# Patient Record
Sex: Female | Born: 1990 | Race: Black or African American | Hispanic: No | Marital: Married | Smoking: Never smoker
Health system: Southern US, Community
[De-identification: ages and names within clinical notes are randomized; demographics above are authoritative.]

## PROBLEM LIST (undated history)

## (undated) DIAGNOSIS — R87629 Unspecified abnormal cytological findings in specimens from vagina: Secondary | ICD-10-CM

## (undated) DIAGNOSIS — A609 Anogenital herpesviral infection, unspecified: Secondary | ICD-10-CM

## (undated) DIAGNOSIS — K589 Irritable bowel syndrome without diarrhea: Secondary | ICD-10-CM

## (undated) HISTORY — DX: Unspecified abnormal cytological findings in specimens from vagina: R87.629

## (undated) HISTORY — DX: Anogenital herpesviral infection, unspecified: A60.9

## (undated) HISTORY — PX: DILATION AND CURETTAGE OF UTERUS: SHX78

---
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
## 2013-07-19 ENCOUNTER — Emergency Department (INDEPENDENT_AMBULATORY_CARE_PROVIDER_SITE_OTHER)
Admission: EM | Admit: 2013-07-19 | Discharge: 2013-07-19 | Disposition: A | Discharge status: Home / Self Care | Payer: BC Managed Care – PPO | Source: Home / Self Care | Attending: Family Medicine | Admitting: Family Medicine

## 2013-07-19 ENCOUNTER — Encounter (HOSPITAL_COMMUNITY): Payer: Self-pay | Admitting: Emergency Medicine

## 2013-07-19 DIAGNOSIS — H0015 Chalazion left lower eyelid: Secondary | ICD-10-CM

## 2013-07-19 DIAGNOSIS — H0019 Chalazion unspecified eye, unspecified eyelid: Secondary | ICD-10-CM

## 2013-07-19 MED ORDER — MOXIFLOXACIN HCL 0.5 % OP SOLN: 1.0000 [drp] | Freq: Three times a day (TID) | OPHTHALMIC | Status: DC

## 2013-07-19 NOTE — Discharge Instructions (Signed)
Call eye doctor in am for follow-up care of eyelid problem.

## 2013-07-19 NOTE — ED Notes (Signed)
Reports that on Saturday her left eye started giving her problems.  Reports lower lid swelling, mostly in am.  Reports swelling resolves during the day.  Reports lower lid is itchy.  Patient thinks she came in contact with clorox

## 2013-07-19 NOTE — ED Provider Notes (Signed)
CSN: [id number]     Arrival date & time 07/19/13  1635 History   First MD Initiated Contact with Patient 07/19/13 1826     Chief Complaint  Patient presents with  . Eye Problem   (Consider location/radiation/quality/duration/timing/severity/associated sxs/prior Treatment) Patient is a 23 y.o. female presenting with eye problem. The history is provided by the patient.  Eye Problem Location:  L eye Quality:  Stinging Severity:  Mild Onset quality:  Sudden Duration:  2 days Progression:  Worsening Chronicity:  New Ineffective treatments:  None tried Associated symptoms: no blurred vision, no crusting, no decreased vision, no discharge, no double vision, no photophobia and no redness     History reviewed. No pertinent past medical history. History reviewed. No pertinent past surgical history. No family history on file. History  Substance Use Topics  . Smoking status: Never Smoker   . Smokeless tobacco: Not on file  . Alcohol Use: No   OB History   Grav Para Term Preterm Abortions TAB SAB Ect Mult Living                 Review of Systems  Constitutional: Negative.   HENT: Negative.   Eyes: Positive for pain. Negative for blurred vision, double vision, photophobia, discharge, redness and visual disturbance.    Allergies  Review of patient's allergies indicates no known allergies.  Home Medications   Prior to Admission medications   Not on File   BP 89/58  Pulse 68  Temp(Src) 98.2 F (36.8 C) (Oral)  Resp 16  SpO2 100%  LMP 07/19/2013 Physical Exam  Nursing note and vitals reviewed. Constitutional: She is oriented to person, place, and time. She appears well-developed and well-nourished.  Eyes: Conjunctivae and EOM are normal. Pupils are equal, round, and reactive to light. Left eye exhibits hordeolum.    Neck: Normal range of motion. Neck supple.  Neurological: She is alert and oriented to person, place, and time.  Skin: Skin is warm and dry.    ED Course    Procedures (including critical care time) Labs Review Labs Reviewed - No data to display  Imaging Review No results found.   MDM   1. Chalazion of left lower eyelid       Linna HoffJames D Kindl, MD 07/19/13 1859

## 2013-08-14 ENCOUNTER — Encounter (HOSPITAL_COMMUNITY): Payer: Self-pay | Admitting: Emergency Medicine

## 2013-08-14 DIAGNOSIS — IMO0001 Reserved for inherently not codable concepts without codable children: Secondary | ICD-10-CM | POA: Insufficient documentation

## 2013-08-14 DIAGNOSIS — N390 Urinary tract infection, site not specified: Secondary | ICD-10-CM | POA: Insufficient documentation

## 2013-08-14 NOTE — ED Notes (Signed)
Pt.  reports fever with body aches , chills and bilateral earache.

## 2013-08-15 ENCOUNTER — Emergency Department (HOSPITAL_COMMUNITY)
Admission: EM | Admit: 2013-08-15 | Discharge: 2013-08-15 | Disposition: A | Discharge status: Home / Self Care | Payer: Self-pay | Attending: Emergency Medicine | Admitting: Emergency Medicine

## 2013-08-15 ENCOUNTER — Emergency Department (HOSPITAL_COMMUNITY): Payer: BC Managed Care – PPO

## 2013-08-15 DIAGNOSIS — N390 Urinary tract infection, site not specified: Secondary | ICD-10-CM

## 2013-08-15 DIAGNOSIS — M791 Myalgia, unspecified site: Secondary | ICD-10-CM

## 2013-08-15 LAB — COMPREHENSIVE METABOLIC PANEL
ALBUMIN: 3.9 g/dL (ref 3.5–5.2)
ALT: 10 U/L (ref 0–35)
AST: 18 U/L (ref 0–37)
Alkaline Phosphatase: 55 U/L (ref 39–117)
BUN: 9 mg/dL (ref 6–23)
CALCIUM: 9.3 mg/dL (ref 8.4–10.5)
CO2: 25 mEq/L (ref 19–32)
Chloride: 102 mEq/L (ref 96–112)
Creatinine, Ser: 0.64 mg/dL (ref 0.50–1.10)
GFR calc Af Amer: >90 mL/min (ref >90)
GFR calc non Af Amer: >90 mL/min (ref >90)
Glucose, Bld: 99 mg/dL (ref 70–99)
Potassium: 4 mEq/L (ref 3.7–5.3)
SODIUM: 139 meq/L (ref 137–147)
TOTAL PROTEIN: 7.3 g/dL (ref 6.0–8.3)
Total Bilirubin: 0.3 mg/dL (ref 0.3–1.2)

## 2013-08-15 LAB — CBC WITH DIFFERENTIAL/PLATELET
BASOS ABS: 0 10*3/uL (ref 0.0–0.1)
BASOS PCT: 0 % (ref 0–1)
EOS ABS: 0 10*3/uL (ref 0.0–0.7)
Eosinophils Relative: 0 % (ref 0–5)
HCT: 32.6 % — ABNORMAL LOW (ref 36.0–46.0)
HEMOGLOBIN: 11.4 g/dL — AB (ref 12.0–15.0)
LYMPHS PCT: 11 % — AB (ref 12–46)
Lymphs Abs: 1.7 10*3/uL (ref 0.7–4.0)
MCH: 31.7 pg (ref 26.0–34.0)
MCHC: 35 g/dL (ref 30.0–36.0)
MCV: 90.6 fL (ref 78.0–100.0)
Monocytes Absolute: 1.5 10*3/uL — ABNORMAL HIGH (ref 0.1–1.0)
Monocytes Relative: 10 % (ref 3–12)
NEUTROS PCT: 79 % — AB (ref 43–77)
Neutro Abs: 12.2 10*3/uL — ABNORMAL HIGH (ref 1.7–7.7)
PLATELETS: 235 10*3/uL (ref 150–400)
RBC: 3.6 MIL/uL — ABNORMAL LOW (ref 3.87–5.11)
RDW: 12 % (ref 11.5–15.5)
WBC: 15.4 10*3/uL — ABNORMAL HIGH (ref 4.0–10.5)

## 2013-08-15 LAB — URINALYSIS, ROUTINE W REFLEX MICROSCOPIC
BILIRUBIN URINE: NEGATIVE
Glucose, UA: NEGATIVE mg/dL
HGB URINE DIPSTICK: NEGATIVE
Ketones, ur: 15 mg/dL — AB
NITRITE: NEGATIVE
PH: 8 (ref 5.0–8.0)
Protein, ur: 30 mg/dL — AB
SPECIFIC GRAVITY, URINE: 1.034 — AB (ref 1.005–1.030)
Urobilinogen, UA: 1 mg/dL (ref 0.0–1.0)

## 2013-08-15 LAB — URINE MICROSCOPIC-ADD ON

## 2013-08-15 LAB — PREGNANCY, URINE: PREG TEST UR: NEGATIVE

## 2013-08-15 MED ORDER — SODIUM CHLORIDE 0.9 % IV BOLUS (SEPSIS)
1000.0000 mL | Freq: Once | INTRAVENOUS | Status: AC
  Administered 2013-08-15: 1000 mL via INTRAVENOUS

## 2013-08-15 MED ORDER — CEPHALEXIN 500 MG PO CAPS: 500.0000 mg | ORAL_CAPSULE | Freq: Three times a day (TID) | ORAL | Status: DC

## 2013-08-15 NOTE — ED Provider Notes (Signed)
CSN: [id number]     Arrival date & time 08/14/13  2253 History   First MD Initiated Contact with Patient 08/15/13 0049     Chief Complaint  Patient presents with  . Fever  . Generalized Body Aches  . Headache     (Consider location/radiation/quality/duration/timing/severity/associated sxs/prior Treatment) HPI Generally healthy young woman to ED with about 6 hrs of subjective fever and myalgias. She says her mom checked her forehead with her hand and told the patient that it felt like her temp was about 102F. However, she did not check her temp with a thermometer because she didn't have one. She says she felt achy all over. She took a Tylenol and now feels better.   She denies URI sx. She has felt nauseated but, denies vomiting. Denies abdominal pain, diarrhea, dysuria, vaginal discharge.   History reviewed. No pertinent past medical history. History reviewed. No pertinent past surgical history. No family history on file. History  Substance Use Topics  . Smoking status: Never Smoker   . Smokeless tobacco: Not on file  . Alcohol Use: No   OB History   Grav Para Term Preterm Abortions TAB SAB Ect Mult Living                 Review of Systems Ten point review of symptoms performed and is negative with the exception of symptoms noted above.     Allergies  Review of patient's allergies indicates no known allergies.  Home Medications   Prior to Admission medications   Medication Sig Start Date End Date Taking? Authorizing Provider  acetaminophen (TYLENOL) 500 MG tablet Take 1,000 mg by mouth every 6 (six) hours as needed for moderate pain.   Yes Historical Provider, MD   BP 104/57  Pulse 83  Temp(Src) 99.1 F (37.3 C) (Oral)  Resp 18  Wt 146 lb (66.225 kg)  SpO2 100%  LMP 07/19/2013 Physical Exam Gen: well developed and well nourished appearing Head: NCAT Eyes: PERL, EOMI Nose: no epistaixis or rhinorrhea Mouth/throat: mucosa is moist and pink Neck: supple, no  stridor, no cervical lymphadenopathy Lungs: CTA B, no wheezing, rhonchi or rales CV: RRR, no murmur, extremities appear well perfused.  Abd: soft, notender, nondistended Back: no ttp, no cva ttp Skin: warm and dry no rash Ext: normal to inspection, no dependent edema Neuro: CN ii-xii grossly intact, no focal deficits Psyche; normal affect,  calm and cooperative.   ED Course  Procedures (including critical care time) Labs Review  Results for orders placed during the hospital encounter of 08/15/13 (from the past 24 hour(s))  CBC WITH DIFFERENTIAL     Status: Abnormal   Collection Time    08/15/13 12:47 AM      Result Value Ref Range   WBC 15.4 (*) 4.0 - 10.5 K/uL   RBC 3.60 (*) 3.87 - 5.11 MIL/uL   Hemoglobin 11.4 (*) 12.0 - 15.0 g/dL   HCT 30.0 (*) 92.3 - 30.0 %   MCV 90.6  78.0 - 100.0 fL   MCH 31.7  26.0 - 34.0 pg   MCHC 35.0  30.0 - 36.0 g/dL   RDW 76.2  26.3 - 33.5 %   Platelets 235  150 - 400 K/uL   Neutrophils Relative % 79 (*) 43 - 77 %   Lymphocytes Relative 11 (*) 12 - 46 %   Monocytes Relative 10  3 - 12 %   Eosinophils Relative 0  0 - 5 %   Basophils Relative 0  0 - 1 %   Neutro Abs 12.2 (*) 1.7 - 7.7 K/uL   Lymphs Abs 1.7  0.7 - 4.0 K/uL   Monocytes Absolute 1.5 (*) 0.1 - 1.0 K/uL   Eosinophils Absolute 0.0  0.0 - 0.7 K/uL   Basophils Absolute 0.0  0.0 - 0.1 K/uL   RBC Morphology POLYCHROMASIA PRESENT     WBC Morphology ATYPICAL LYMPHOCYTES    COMPREHENSIVE METABOLIC PANEL     Status: None   Collection Time    08/15/13 12:47 AM      Result Value Ref Range   Sodium 139  137 - 147 mEq/L   Potassium 4.0  3.7 - 5.3 mEq/L   Chloride 102  96 - 112 mEq/L   CO2 25  19 - 32 mEq/L   Glucose, Bld 99  70 - 99 mg/dL   BUN 9  6 - 23 mg/dL   Creatinine, Ser 1.610.64  0.50 - 1.10 mg/dL   Calcium 9.3  8.4 - 09.610.5 mg/dL   Total Protein 7.3  6.0 - 8.3 g/dL   Albumin 3.9  3.5 - 5.2 g/dL   AST 18  0 - 37 U/L   ALT 10  0 - 35 U/L   Alkaline Phosphatase 55  39 - 117 U/L    Total Bilirubin 0.3  0.3 - 1.2 mg/dL   GFR calc non Af Amer >90  >90 mL/min   GFR calc Af Amer >90  >90 mL/min  URINALYSIS, ROUTINE W REFLEX MICROSCOPIC     Status: Abnormal   Collection Time    08/15/13  1:05 AM      Result Value Ref Range   Color, Urine YELLOW  YELLOW   APPearance CLOUDY (*) CLEAR   Specific Gravity, Urine 1.034 (*) 1.005 - 1.030   pH 8.0  5.0 - 8.0   Glucose, UA NEGATIVE  NEGATIVE mg/dL   Hgb urine dipstick NEGATIVE  NEGATIVE   Bilirubin Urine NEGATIVE  NEGATIVE   Ketones, ur 15 (*) NEGATIVE mg/dL   Protein, ur 30 (*) NEGATIVE mg/dL   Urobilinogen, UA 1.0  0.0 - 1.0 mg/dL   Nitrite NEGATIVE  NEGATIVE   Leukocytes, UA SMALL (*) NEGATIVE  PREGNANCY, URINE     Status: None   Collection Time    08/15/13  1:05 AM      Result Value Ref Range   Preg Test, Ur NEGATIVE  NEGATIVE  URINE MICROSCOPIC-ADD ON     Status: Abnormal   Collection Time    08/15/13  1:05 AM      Result Value Ref Range   Squamous Epithelial / LPF MANY (*) RARE   WBC, UA 11-20  <3 WBC/hpf   RBC / HPF 0-2  <3 RBC/hpf   Bacteria, UA MANY (*) RARE   Urine-Other MUCOUS PRESENT      MDM   Patient with contaminated urine which is notable for 11 to 20 wbc/hpf. Patient is feeling bertter after IVF. She is asking to go home. Pulse has noralized. She is stable for discharge with plan for close outpatient folllow up and empiric keflex for UTI.    Brandt LoosenJulie Sholanda Croson, MD 08/15/13 [phone]

## 2013-08-15 NOTE — ED Notes (Signed)
Patient transported to X-ray 

## 2014-04-27 ENCOUNTER — Encounter (HOSPITAL_COMMUNITY): Payer: Self-pay | Admitting: Emergency Medicine

## 2014-04-27 ENCOUNTER — Emergency Department (INDEPENDENT_AMBULATORY_CARE_PROVIDER_SITE_OTHER)
Admission: EM | Admit: 2014-04-27 | Discharge: 2014-04-27 | Disposition: A | Discharge status: Home / Self Care | Payer: BLUE CROSS/BLUE SHIELD | Source: Home / Self Care | Attending: Family Medicine | Admitting: Family Medicine

## 2014-04-27 DIAGNOSIS — N390 Urinary tract infection, site not specified: Secondary | ICD-10-CM

## 2014-04-27 LAB — POCT URINALYSIS DIP (DEVICE)
BILIRUBIN URINE: NEGATIVE
GLUCOSE, UA: NEGATIVE mg/dL
KETONES UR: NEGATIVE mg/dL
Nitrite: NEGATIVE
Protein, ur: >=300 mg/dL — AB
SPECIFIC GRAVITY, URINE: 1.025 (ref 1.005–1.030)
Urobilinogen, UA: 0.2 mg/dL (ref 0.0–1.0)
pH: 7.5 (ref 5.0–8.0)

## 2014-04-27 LAB — POCT PREGNANCY, URINE: Preg Test, Ur: NEGATIVE

## 2014-04-27 MED ORDER — CEPHALEXIN 500 MG PO CAPS: 500.0000 mg | ORAL_CAPSULE | Freq: Three times a day (TID) | ORAL | Status: DC

## 2014-04-27 NOTE — Discharge Instructions (Signed)
Thank you for coming in today. If your belly pain worsens, or you have high fever, bad vomiting, blood in your stool or black tarry stool go to the Emergency Room.   Urinary Tract Infection Urinary tract infections (UTIs) can develop anywhere along your urinary tract. Your urinary tract is your body's drainage system for removing wastes and extra water. Your urinary tract includes two kidneys, two ureters, a bladder, and a urethra. Your kidneys are a pair of bean-shaped organs. Each kidney is about the size of your fist. They are located below your ribs, one on each side of your spine. CAUSES Infections are caused by microbes, which are microscopic organisms, including fungi, viruses, and bacteria. These organisms are so small that they can only be seen through a microscope. Bacteria are the microbes that most commonly cause UTIs. SYMPTOMS  Symptoms of UTIs may vary by age and gender of the patient and by the location of the infection. Symptoms in young women typically include a frequent and intense urge to urinate and a painful, burning feeling in the bladder or urethra during urination. Older women and men are more likely to be tired, shaky, and weak and have muscle aches and abdominal pain. A fever may mean the infection is in your kidneys. Other symptoms of a kidney infection include pain in your back or sides below the ribs, nausea, and vomiting. DIAGNOSIS To diagnose a UTI, your caregiver will ask you about your symptoms. Your caregiver also will ask to provide a urine sample. The urine sample will be tested for bacteria and white blood cells. White blood cells are made by your body to help fight infection. TREATMENT  Typically, UTIs can be treated with medication. Because most UTIs are caused by a bacterial infection, they usually can be treated with the use of antibiotics. The choice of antibiotic and length of treatment depend on your symptoms and the type of bacteria causing your  infection. HOME CARE INSTRUCTIONS  If you were prescribed antibiotics, take them exactly as your caregiver instructs you. Finish the medication even if you feel better after you have only taken some of the medication.  Drink enough water and fluids to keep your urine clear or pale yellow.  Avoid caffeine, tea, and carbonated beverages. They tend to irritate your bladder.  Empty your bladder often. Avoid holding urine for long periods of time.  Empty your bladder before and after sexual intercourse.  After a bowel movement, women should cleanse from front to back. Use each tissue only once. SEEK MEDICAL CARE IF:   You have back pain.  You develop a fever.  Your symptoms do not begin to resolve within 3 days. SEEK IMMEDIATE MEDICAL CARE IF:   You have severe back pain or lower abdominal pain.  You develop chills.  You have nausea or vomiting.  You have continued burning or discomfort with urination. MAKE SURE YOU:   Understand these instructions.  Will watch your condition.  Will get help right away if you are not doing well or get worse. Document Released: 12/05/2004 Document Revised: 08/27/2011 Document Reviewed: 04/05/2011 Exit[employer] Patient Information 2015 [employer]. This information is not intended to replace advice given to you by your health care provider. Make sure you discuss any questions you have with your health care provider.  

## 2014-04-27 NOTE — ED Notes (Signed)
C/o UTI sx onset 4 days Sx include: dysuria, hematuria, cloudy urine Denies fevers, chills, abd pain, d/c Alert, no signs of acute distress.

## 2014-04-27 NOTE — ED Provider Notes (Signed)
Shanon AceVashti Benjiman CoreHinton is a 24 y.o. female who presents to Urgent Care today for urinary frequency urgency dysuria or cloudy area and some hematuria present for 2 days. Symptoms consistent with prior episodes of urinary tract infection. No fevers or chills nausea vomiting or diarrhea. No vaginal discharge. Patient has not tried any medications yet. She feels well otherwise.   History reviewed. No pertinent past medical history. History reviewed. No pertinent past surgical history. History  Substance Use Topics  . Smoking status: Never Smoker   . Smokeless tobacco: Not on file  . Alcohol Use: No   ROS as above Medications: No current facility-administered medications for this encounter.   Current Outpatient Prescriptions  Medication Sig Dispense Refill  . acetaminophen (TYLENOL) 500 MG tablet Take 1,000 mg by mouth every 6 (six) hours as needed for moderate pain.    . cephALEXin (KEFLEX) 500 MG capsule Take 1 capsule (500 mg total) by mouth 3 (three) times daily. 21 capsule 0   No Known Allergies   Exam:  BP 108/69 mmHg  Pulse 68  Temp(Src) 98.3 F (36.8 C) (Oral)  Resp 14  SpO2 100% Gen: Well NAD HEENT: EOMI,  MMM Lungs: Normal work of breathing. CTABL Heart: RRR no MRG Abd: NABS, Soft. Nondistended, Nontender no CV angle tenderness to percussion Exts: Brisk capillary refill, warm and well perfused.   Results for orders placed or performed during the hospital encounter of 04/27/14 (from the past 24 hour(s))  POCT urinalysis dip (device)     Status: Abnormal   Collection Time: 04/27/14  1:36 PM  Result Value Ref Range   Glucose, UA NEGATIVE NEGATIVE mg/dL   Bilirubin Urine NEGATIVE NEGATIVE   Ketones, ur NEGATIVE NEGATIVE mg/dL   Specific Gravity, Urine 1.025 1.005 - 1.030   Hgb urine dipstick LARGE (A) NEGATIVE   pH 7.5 5.0 - 8.0   Protein, ur >=300 (A) NEGATIVE mg/dL   Urobilinogen, UA 0.2 0.0 - 1.0 mg/dL   Nitrite NEGATIVE NEGATIVE   Leukocytes, UA LARGE (A) NEGATIVE   Pregnancy, urine POC     Status: None   Collection Time: 04/27/14  1:42 PM  Result Value Ref Range   Preg Test, Ur NEGATIVE NEGATIVE   No results found.  Assessment and Plan: 24 y.o. female with urinary tract infection. Treat with Keflex.  Discussed warning signs or symptoms. Please see discharge instructions. Patient expresses understanding.     Rodolph BongEvan S Romon Devereux, MD 04/27/14 [phone]

## 2014-05-08 ENCOUNTER — Encounter (HOSPITAL_COMMUNITY): Payer: Self-pay | Admitting: *Deleted

## 2014-05-08 ENCOUNTER — Emergency Department (HOSPITAL_COMMUNITY): Payer: BLUE CROSS/BLUE SHIELD

## 2014-05-08 ENCOUNTER — Inpatient Hospital Stay (HOSPITAL_COMMUNITY)
Admission: EM | Admit: 2014-05-08 | Discharge: 2014-05-11 | DRG: 872 | Disposition: A | Discharge status: Home / Self Care | Payer: BLUE CROSS/BLUE SHIELD | Attending: Internal Medicine | Admitting: Internal Medicine

## 2014-05-08 DIAGNOSIS — D72829 Elevated white blood cell count, unspecified: Secondary | ICD-10-CM | POA: Diagnosis present

## 2014-05-08 DIAGNOSIS — R509 Fever, unspecified: Secondary | ICD-10-CM | POA: Insufficient documentation

## 2014-05-08 DIAGNOSIS — B9620 Unspecified Escherichia coli [E. coli] as the cause of diseases classified elsewhere: Secondary | ICD-10-CM | POA: Diagnosis present

## 2014-05-08 DIAGNOSIS — N12 Tubulo-interstitial nephritis, not specified as acute or chronic: Secondary | ICD-10-CM | POA: Diagnosis present

## 2014-05-08 DIAGNOSIS — A419 Sepsis, unspecified organism: Principal | ICD-10-CM | POA: Diagnosis present

## 2014-05-08 DIAGNOSIS — R112 Nausea with vomiting, unspecified: Secondary | ICD-10-CM | POA: Diagnosis present

## 2014-05-08 LAB — CBC
HCT: 36.4 % (ref 36.0–46.0)
Hemoglobin: 12.4 g/dL (ref 12.0–15.0)
MCH: 31.2 pg (ref 26.0–34.0)
MCHC: 34.1 g/dL (ref 30.0–36.0)
MCV: 91.7 fL (ref 78.0–100.0)
Platelets: 300 10*3/uL (ref 150–400)
RBC: 3.97 MIL/uL (ref 3.87–5.11)
RDW: 12.2 % (ref 11.5–15.5)
WBC: 14.2 10*3/uL — ABNORMAL HIGH (ref 4.0–10.5)

## 2014-05-08 LAB — BASIC METABOLIC PANEL
ANION GAP: 8 (ref 5–15)
BUN: 11 mg/dL (ref 6–23)
CALCIUM: 8.9 mg/dL (ref 8.4–10.5)
CO2: 22 mmol/L (ref 19–32)
Chloride: 104 mmol/L (ref 96–112)
Creatinine, Ser: 0.69 mg/dL (ref 0.50–1.10)
Glucose, Bld: 98 mg/dL (ref 70–99)
Potassium: 3.6 mmol/L (ref 3.5–5.1)
SODIUM: 134 mmol/L — AB (ref 135–145)

## 2014-05-08 LAB — URINALYSIS, ROUTINE W REFLEX MICROSCOPIC
Bilirubin Urine: NEGATIVE
GLUCOSE, UA: NEGATIVE mg/dL
Ketones, ur: NEGATIVE mg/dL
Nitrite: POSITIVE — AB
PH: 7 (ref 5.0–8.0)
Protein, ur: 100 mg/dL — AB
SPECIFIC GRAVITY, URINE: 1.015 (ref 1.005–1.030)
UROBILINOGEN UA: 0.2 mg/dL (ref 0.0–1.0)

## 2014-05-08 LAB — I-STAT CG4 LACTIC ACID, ED: Lactic Acid, Venous: 2.25 mmol/L (ref 0.5–2.0)

## 2014-05-08 LAB — PREGNANCY, URINE: Preg Test, Ur: NEGATIVE

## 2014-05-08 LAB — URINE MICROSCOPIC-ADD ON

## 2014-05-08 MED ORDER — ACETAMINOPHEN 500 MG PO TABS
1000.0000 mg | ORAL_TABLET | Freq: Once | ORAL | Status: AC
  Administered 2014-05-08: 1000 mg via ORAL
  Filled 2014-05-08: qty 2

## 2014-05-08 MED ORDER — SODIUM CHLORIDE 0.9 % IV BOLUS (SEPSIS)
1000.0000 mL | Freq: Once | INTRAVENOUS | Status: AC
  Administered 2014-05-08: 1000 mL via INTRAVENOUS

## 2014-05-08 MED ORDER — ONDANSETRON HCL 4 MG/2ML IJ SOLN
4.0000 mg | Freq: Once | INTRAMUSCULAR | Status: AC
  Administered 2014-05-08: 4 mg via INTRAVENOUS
  Filled 2014-05-08: qty 2

## 2014-05-08 MED ORDER — KETOROLAC TROMETHAMINE 30 MG/ML IJ SOLN
30.0000 mg | Freq: Once | INTRAMUSCULAR | Status: AC
  Administered 2014-05-08: 30 mg via INTRAVENOUS
  Filled 2014-05-08: qty 1

## 2014-05-08 MED ORDER — DEXTROSE 5 % IV SOLN: 1.0000 g | Freq: Once | INTRAVENOUS | Status: DC

## 2014-05-08 MED ORDER — ACETAMINOPHEN 500 MG PO TABS
500.0000 mg | ORAL_TABLET | Freq: Once | ORAL | Status: DC
  Filled 2014-05-08: qty 1

## 2014-05-08 MED ORDER — DEXTROSE 5 % IV SOLN
1.0000 g | Freq: Once | INTRAVENOUS | Status: AC
  Administered 2014-05-08: 1 g via INTRAVENOUS
  Filled 2014-05-08: qty 10

## 2014-05-08 MED ORDER — SODIUM CHLORIDE 0.9 % IV BOLUS (SEPSIS): 1000.0000 mL | Freq: Once | INTRAVENOUS | Status: DC

## 2014-05-08 MED ORDER — SODIUM CHLORIDE 0.9 % IV SOLN: 1000.0000 mL | INTRAVENOUS | Status: DC

## 2014-05-08 NOTE — ED Provider Notes (Signed)
The patient is a 24 year old female, she had a recent urinary tract infection and presents back to the hospital today with recurrent urinary symptoms as well as flank pain, fever, chills, myalgias, headache and a new onset sore throat. She states that she took a full 7 days of Keflex, tolerated the medication well and improved however she had recurrent symptoms over the course of the day. On exam the patient has a soft nontender abdomen, right CVA tenderness, no peripheral edema, mild tachycardia, obvious rigors, clear oropharynx with moist mucous membranes and no pharyngeal erythema. She has a very supple neck without lymphadenopathy or any stiffness. Conjunctivae are clear.  Likely recurrent urinary infection, she will need IV fluids, she is mildly hypotensive at 96 systolic, heart rate of 101, no objective fever on arrival.  Throughout the patient's course she stayed persistently tachycardic, her blood pressure improved from 96 systolic 210 with IV fluids. She was given 30 mL/kg bolus which was approximately 2000 mL. Her temperature measured at 101.6, her white blood cell count was over 14,000, lactic acid was ordered, the patient appears to have sepsis from pyelonephritis.  Discussed with the patient and her family members the risks benefits and alternatives of admission to the hospital. They have agreed with admission to the hospital. Maryclare LabradorWe'll discuss with the internal medicine physicians for admission. The patient does not have her own family doctor, she still occasionally goes to her pediatric practice.  Critical care delivered for sepsis related to his severe pyelonephritis involving leukocytosis, tachycardia, hypotension, fever. 2 g of Rocephin given.  CRITICAL CARE Performed by: Vida RollerMILLER,Nika Yazzie D Total critical care time: 35 Critical care time was exclusive of separately billable procedures and treating other patients. Critical care was necessary to treat or prevent imminent or life-threatening  deterioration. Critical care was time spent personally by me on the following activities: development of treatment plan with patient and/or surrogate as well as nursing, discussions with consultants, evaluation of patient's response to treatment, examination of patient, obtaining history from patient or surrogate, ordering and performing treatments and interventions, ordering and review of laboratory studies, ordering and review of radiographic studies, pulse oximetry and re-evaluation of patient's condition.    Filed Vitals:   05/08/14 2109  BP: 122/60  Pulse: 101  Temp: 99.5 F (37.5 C)  TempSrc: Oral  Resp: 14  SpO2: 100%   Medical screening examination/treatment/procedure(s) were conducted as a shared visit with non-physician practitioner(s) and myself.  I personally evaluated the patient during the encounter.  Clinical Impression:   Final diagnoses:  Sepsis, due to unspecified organism  Pyelonephritis          Vida RollerBrian D Jael Waldorf, MD 05/09/14 2027

## 2014-05-08 NOTE — ED Provider Notes (Signed)
CSN: [id number]     Arrival date & time 05/08/14  2045 History   First MD Initiated Contact with Patient 05/08/14 2059     Chief Complaint  Patient presents with  . Fever     (Consider location/radiation/quality/duration/timing/severity/associated sxs/prior Treatment) Patient is a 24 y.o. female presenting with fever. The history is provided by the patient. No language interpreter was used.  Fever Associated symptoms: chills and headaches   Associated symptoms: no cough   Ms. Coppernoll had a UTI last week and presents with fever, body aches, chills, back pain, dysuria, and vomiting today.  Her dad says she finished a course of keflex for the UTI on Wednesday but began having gradual recurring urinary symptoms on Thursday.  She denies any shortness of breath, cough, or sore throat.   History reviewed. No pertinent past medical history. History reviewed. No pertinent past surgical history. History reviewed. No pertinent family history. History  Substance Use Topics  . Smoking status: Never Smoker   . Smokeless tobacco: Never Used  . Alcohol Use: No   OB History    No data available     Review of Systems  Constitutional: Positive for fever and chills.  Respiratory: Negative for cough and shortness of breath.   Neurological: Positive for headaches. Negative for syncope.  All other systems reviewed and are negative.     Allergies  Review of patient's allergies indicates no known allergies.  Home Medications   Prior to Admission medications   Medication Sig Start Date End Date Taking? Authorizing Provider  acetaminophen (TYLENOL) 500 MG tablet Take 1,000 mg by mouth every 6 (six) hours as needed for moderate pain.   Yes Historical Provider, MD  naproxen sodium (ANAPROX) 220 MG tablet Take 220 mg by mouth 3 (three) times daily as needed (pain).   Yes Historical Provider, MD  cephALEXin (KEFLEX) 500 MG capsule Take 1 capsule (500 mg total) by mouth 3 (three) times daily. Patient  not taking: Reported on 05/08/2014 04/27/14   Rodolph Bong, MD   BP 122/60 mmHg  Pulse 101  Temp(Src) 101.6 F (38.7 C) (Rectal)  Resp 14  SpO2 100%  LMP 05/07/2014 Physical Exam  Constitutional: She is oriented to person, place, and time. She appears well-developed and well-nourished.  HENT:  Head: Normocephalic and atraumatic.  Eyes: Conjunctivae are normal.  Neck: Normal range of motion. Neck supple.  Cardiovascular: Normal rate, regular rhythm and normal heart sounds.   Pulmonary/Chest: Effort normal and breath sounds normal.  Abdominal: Soft. There is tenderness in the suprapubic area and left upper quadrant.  Musculoskeletal: Normal range of motion.  Neurological: She is alert and oriented to person, place, and time.  Skin: Skin is warm and dry.  Nursing note and vitals reviewed.   ED Course  Procedures (including critical care time) Labs Review Labs Reviewed  URINALYSIS, ROUTINE W REFLEX MICROSCOPIC - Abnormal; Notable for the following:    APPearance TURBID (*)    Hgb urine dipstick LARGE (*)    Protein, ur 100 (*)    Nitrite POSITIVE (*)    Leukocytes, UA MODERATE (*)    All other components within normal limits  BASIC METABOLIC PANEL - Abnormal; Notable for the following:    Sodium 134 (*)    All other components within normal limits  CBC - Abnormal; Notable for the following:    WBC 14.2 (*)    All other components within normal limits  URINE MICROSCOPIC-ADD ON - Abnormal; Notable for the following:  Bacteria, UA MANY (*)    All other components within normal limits  I-STAT CG4 LACTIC ACID, ED - Abnormal; Notable for the following:    Lactic Acid, Venous 2.25 (*)    All other components within normal limits  URINE CULTURE  CULTURE, BLOOD (ROUTINE X 2)  CULTURE, BLOOD (ROUTINE X 2)  URINE CULTURE  PREGNANCY, URINE  CBC WITH DIFFERENTIAL/PLATELET  COMPREHENSIVE METABOLIC PANEL  URINALYSIS, ROUTINE W REFLEX MICROSCOPIC  I-STAT CG4 LACTIC ACID, ED     Imaging Review No results found.   EKG Interpretation None      MDM   Final diagnoses:  Sepsis, due to unspecified organism  Pyelonephritis  Labs: WBC 14.2.  Lactic acid 2.25. UA with nitrites and moderate leukocytes. Patient tachycardic throughout her stay. Rectal temp 101.6.  21:40 Patient given tylenol and 1g of rocephin.  22:49 Patient meets sepsis criteria and will be admitted. Patient and dad agree with plan. Patient given an addition 1g of rocephin.    Catha GosselinHanna Patel-Mills, PA-C 05/08/14 2319  Vida RollerBrian D Miller, MD 05/09/14 2027

## 2014-05-08 NOTE — ED Notes (Signed)
Dr. Hyacinth MeekerMiller notified of CG-4

## 2014-05-08 NOTE — ED Notes (Signed)
Pt. Was seen at her PCP for a UTI and had taken all her ABX. Starting today she was having N/V pt hot to the touch. Called EMS because she was weak and couldn't walk.

## 2014-05-09 DIAGNOSIS — D72829 Elevated white blood cell count, unspecified: Secondary | ICD-10-CM | POA: Diagnosis present

## 2014-05-09 DIAGNOSIS — N12 Tubulo-interstitial nephritis, not specified as acute or chronic: Secondary | ICD-10-CM

## 2014-05-09 DIAGNOSIS — R112 Nausea with vomiting, unspecified: Secondary | ICD-10-CM

## 2014-05-09 DIAGNOSIS — R509 Fever, unspecified: Secondary | ICD-10-CM

## 2014-05-09 LAB — CBC
HCT: 30.8 % — ABNORMAL LOW (ref 36.0–46.0)
Hemoglobin: 10.5 g/dL — ABNORMAL LOW (ref 12.0–15.0)
MCH: 31.7 pg (ref 26.0–34.0)
MCHC: 34.1 g/dL (ref 30.0–36.0)
MCV: 93.1 fL (ref 78.0–100.0)
Platelets: 249 10*3/uL (ref 150–400)
RBC: 3.31 MIL/uL — ABNORMAL LOW (ref 3.87–5.11)
RDW: 12.4 % (ref 11.5–15.5)
WBC: 9.5 10*3/uL (ref 4.0–10.5)

## 2014-05-09 LAB — BASIC METABOLIC PANEL
Anion gap: 6 (ref 5–15)
BUN: 8 mg/dL (ref 6–23)
CALCIUM: 8.1 mg/dL — AB (ref 8.4–10.5)
CO2: 24 mmol/L (ref 19–32)
Chloride: 108 mmol/L (ref 96–112)
Creatinine, Ser: 0.6 mg/dL (ref 0.50–1.10)
GFR calc non Af Amer: >90 mL/min (ref >90)
GLUCOSE: 91 mg/dL (ref 70–99)
POTASSIUM: 3.3 mmol/L — AB (ref 3.5–5.1)
Sodium: 138 mmol/L (ref 135–145)

## 2014-05-09 LAB — CLOSTRIDIUM DIFFICILE BY PCR: CDIFFPCR: NEGATIVE

## 2014-05-09 MED ORDER — POTASSIUM CHLORIDE CRYS ER 20 MEQ PO TBCR
40.0000 meq | EXTENDED_RELEASE_TABLET | Freq: Once | ORAL | Status: AC
  Administered 2014-05-09: 40 meq via ORAL
  Filled 2014-05-09: qty 2

## 2014-05-09 MED ORDER — CEFTRIAXONE SODIUM IN DEXTROSE 20 MG/ML IV SOLN
1.0000 g | INTRAVENOUS | Status: DC
  Administered 2014-05-09 – 2014-05-10 (×2): 1 g via INTRAVENOUS
  Filled 2014-05-09 (×3): qty 50

## 2014-05-09 MED ORDER — HYDROMORPHONE HCL 1 MG/ML IJ SOLN
0.5000 mg | INTRAMUSCULAR | Status: DC | PRN
  Filled 2014-05-09: qty 1

## 2014-05-09 MED ORDER — ENOXAPARIN SODIUM 40 MG/0.4ML ~~LOC~~ SOLN
40.0000 mg | SUBCUTANEOUS | Status: DC
  Administered 2014-05-09 – 2014-05-11 (×3): 40 mg via SUBCUTANEOUS
  Filled 2014-05-09 (×4): qty 0.4

## 2014-05-09 MED ORDER — SODIUM CHLORIDE 0.9 % IJ SOLN
3.0000 mL | Freq: Two times a day (BID) | INTRAMUSCULAR | Status: DC
  Administered 2014-05-09 – 2014-05-11 (×6): 3 mL via INTRAVENOUS

## 2014-05-09 MED ORDER — SODIUM CHLORIDE 0.9 % IV SOLN
INTRAVENOUS | Status: DC
  Administered 2014-05-09: 01:00:00 via INTRAVENOUS

## 2014-05-09 MED ORDER — ONDANSETRON HCL 4 MG PO TABS: 4.0000 mg | ORAL_TABLET | Freq: Four times a day (QID) | ORAL | Status: DC | PRN

## 2014-05-09 MED ORDER — ACETAMINOPHEN 325 MG PO TABS
650.0000 mg | ORAL_TABLET | Freq: Four times a day (QID) | ORAL | Status: DC | PRN
  Administered 2014-05-09 – 2014-05-11 (×5): 650 mg via ORAL
  Filled 2014-05-09 (×4): qty 2

## 2014-05-09 MED ORDER — ONDANSETRON HCL 4 MG/2ML IJ SOLN: 4.0000 mg | Freq: Four times a day (QID) | INTRAMUSCULAR | Status: DC | PRN

## 2014-05-09 MED ORDER — ALUM & MAG HYDROXIDE-SIMETH 200-200-20 MG/5ML PO SUSP: 30.0000 mL | Freq: Four times a day (QID) | ORAL | Status: DC | PRN

## 2014-05-09 MED ORDER — ACETAMINOPHEN 650 MG RE SUPP: 650.0000 mg | Freq: Four times a day (QID) | RECTAL | Status: DC | PRN

## 2014-05-09 MED ORDER — INFLUENZA VAC SPLIT QUAD 0.5 ML IM SUSY
0.5000 mL | PREFILLED_SYRINGE | INTRAMUSCULAR | Status: AC
  Administered 2014-05-10: 0.5 mL via INTRAMUSCULAR
  Filled 2014-05-09: qty 0.5

## 2014-05-09 MED ORDER — OXYCODONE HCL 5 MG PO TABS
5.0000 mg | ORAL_TABLET | ORAL | Status: DC | PRN
Start: 2014-05-09 — End: 2014-05-11
  Administered 2014-05-10 – 2014-05-11 (×3): 5 mg via ORAL
  Filled 2014-05-09 (×3): qty 1

## 2014-05-09 NOTE — Progress Notes (Addendum)
Patient admitted after midnight, please see H&P.  Continue IV abx, await cultures.  If fevers continue, renal U/S to r/o abscess Recently seen at urgent care with UTI- treated with keflex  Marlin CanaryJessica Cortlandt Capuano DO

## 2014-05-09 NOTE — H&P (Signed)
[facility] Admission History and Physical       Felicia Hatfield:[id number] DOB: 1990/05/06 DOA: 05/08/2014  Referring physician: EDP PCP: No primary care provider on file.  Specialists:   Chief Complaint: Fever and Chills  HPI: Felicia Hatfield is a 24 y.o. female who presents to the ED with complaints of fevers and chills and dysuria and ABD and Flank pain  X 2 days.  She was seen at and area [facility] and diagnosed with a UTI and was prescribed a 10 day course of Keflex of which she completed 3 days ago.   Her symptoms returned 2 days ago.   She was evaluated in the ED and found to have an positive UA and Blood and Urine cultures were sent and she was administered IV Rocephin and IV Fluids and referred for admission.      Review of Systems:  Constitutional: No Weight Loss, No Weight Gain, Night Sweats, +Fevers, +Chills, Dizziness, Light Headedness, Fatigue, or Generalized Weakness HEENT: No Headaches, Difficulty Swallowing,Tooth/Dental Problems,Sore Throat,  No Sneezing, Rhinitis, Ear Ache, Nasal Congestion, or Post Nasal Drip,  Cardio-vascular:  No Chest pain, Orthopnea, PND, Edema in Lower Extremities, Anasarca, Dizziness, Palpitations  Resp: No Dyspnea, No DOE, No Productive Cough, No Non-Productive Cough, No Hemoptysis, No Wheezing.    GI: No Heartburn, Indigestion, +Abdominal Pain, +Nausea, +Vomiting, Diarrhea, Constipation, Hematemesis, Hematochezia, Melena, Change in Bowel Habits,  Loss of Appetite  GU: +Dysuria, No Change in Color of Urine, No Urgency or Urinary Frequency, +Flank pain.  Musculoskeletal: No Joint Pain or Swelling, No Decreased Range of Motion, No Back Pain.  Neurologic: No Syncope, No Seizures, Muscle Weakness, Paresthesia, Vision Disturbance or Loss, No Diplopia, No Vertigo, No Difficulty Walking,  Skin: No Rash or Lesions. Psych: No Change in Mood or Affect, No Depression or Anxiety, No Memory loss, No Confusion, or Hallucinations   History reviewed. No  pertinent past medical history.   History reviewed. No pertinent past surgical history.    Prior to Admission medications   Medication Sig Start Date End Date Taking? Authorizing Provider  acetaminophen (TYLENOL) 500 MG tablet Take 1,000 mg by mouth every 6 (six) hours as needed for moderate pain.   Yes Historical Provider, MD  naproxen sodium (ANAPROX) 220 MG tablet Take 220 mg by mouth 3 (three) times daily as needed (pain).   Yes Historical Provider, MD  cephALEXin (KEFLEX) 500 MG capsule Take 1 capsule (500 mg total) by mouth 3 (three) times daily. Patient not taking: Reported on 05/08/2014 04/27/14   Rodolph Bong, MD     No Known Allergies  Social History:  reports that she has never smoked. She has never used smokeless tobacco. She reports that she does not drink alcohol or use illicit drugs.    History reviewed. No pertinent family history.     Physical Exam:  GEN:  Pleasant Well Nourished and Well Developed  24 y.o. African American female examined and in no acute distress; cooperative with exam Filed Vitals:   05/08/14 2115 05/08/14 2153 05/08/14 2230 05/09/14 0013  BP: 118/61  119/60 110/50  Pulse: 99  96 95  Temp:  101.6 F (38.7 C)  99.5 F (37.5 C)  TempSrc:  Rectal  Oral  Resp: Height:     (1.626 m)  Weight:    68.357 kg (150 lb 11.2 oz)  SpO2: 99%  100% 99%   Blood pressure 110/50, pulse 95, temperature 99.5 F (37.5 C), temperature source Oral,  resp. rate 20, height 5\' 4"  (1.626 m), weight 68.357 kg (150 lb 11.2 oz), last menstrual period 05/02/2014, SpO2 99 %. PSYCH: She is alert and oriented x4; does not appear anxious does not appear depressed; affect is normal HEENT: Normocephalic and Atraumatic, Mucous membranes pink; PERRLA; EOM intact; Fundi:  Benign;  No scleral icterus, Nares: Patent, Oropharynx: Clear,  Fair Dentition,    Neck:  FROM, No Cervical Lymphadenopathy nor Thyromegaly or Carotid Bruit; No JVD; Breasts:: Not examined CHEST  WALL: No tenderness CHEST: Normal respiration, clear to auscultation bilaterally HEART: Regular rate and rhythm; no murmurs rubs or gallops BACK: No kyphosis or scoliosis; No CVA tenderness ABDOMEN: Positive Bowel Sounds, Soft Non-Tender, No Rebound or Guarding; No Masses, No Organomegaly. Rectal Exam: Not done EXTREMITIES: No Cyanosis, Clubbing, or Edema; No Ulcerations. Genitalia: not examined PULSES: 2+ and symmetric SKIN: Normal hydration no rash or ulceration CNS:  Alert and Oriented x 4, No Focal Deficits Vascular: pulses palpable throughout    Labs on Admission:  Basic Metabolic Panel:  Recent Labs Lab 05/08/14 2126  NA 134*  K 3.6  CL 104  CO2 22  GLUCOSE 98  BUN 11  CREATININE 0.69  CALCIUM 8.9   Liver Function Tests: No results for input(s): AST, ALT, ALKPHOS, BILITOT, PROT, ALBUMIN in the last 168 hours. No results for input(s): LIPASE, AMYLASE in the last 168 hours. No results for input(s): AMMONIA in the last 168 hours. CBC:  Recent Labs Lab 05/08/14 2126  WBC 14.2*  HGB 12.4  HCT 36.4  MCV 91.7  PLT 300   Cardiac Enzymes: No results for input(s): CKTOTAL, CKMB, CKMBINDEX, TROPONINI in the last 168 hours.  BNP (last 3 results) No results for input(s): BNP in the last 8760 hours.  ProBNP (last 3 results) No results for input(s): PROBNP in the last 8760 hours.  CBG: No results for input(s): GLUCAP in the last 168 hours.  Radiological Exams on Admission: Dg Chest Port 1 View  05/08/2014   CLINICAL DATA:  Fever and body aches  EXAM: PORTABLE CHEST - 1 VIEW  COMPARISON:  08/15/2013  FINDINGS: Normal heart size and negative mediastinal contours (convexity of the upper right mediastinum is stable given accentuation by rightward rotation). No acute infiltrate or edema. No effusion or pneumothorax. Thoracic dextroscoliosis. No acute osseous findings.  IMPRESSION: No active disease.   Electronically Signed   By: Marnee SpringJonathon  Watts M.D.   On: 05/08/2014 23:46        Assessment/Plan:   24 y.o. female with  Principal Problem:   1.   Sepsis   Blood Cultures , and Urine Culture Sent   IV Rocephin  Active Problems:   2.   Pyelonephritis   IV Rocephin   Adjust PRN Urine Culture Results     3.   Leukocytosis   Monitor Trend     4.   Nausea and vomiting   Anti-Emetics PRN     5.   DVT Prophylaxis    Lovenox     Code Status:     FULL CODE        Family Communication:   Father at Bedside     Disposition Plan:    Inpatient Status        Time spent:  4960 70 Minutes      Ron ParkerJENKINS,Tyja Gortney C [facility] Pager [phone]   If 7AM -7PM Please Contact the Day Rounding Team MD for [facility]  If 7PM-7AM, Please Contact Night-Floor Coverage  [email or web address] Password  [email or web address] 05/09/2014, 4:50 AM     ADDENDUM:   Patient was seen and examined on 05/09/2014

## 2014-05-09 NOTE — Progress Notes (Signed)
Admission note:  Arrival Method: Pt arrived in wheelchair from ED Mental Orientation: Alert and oriented x 4 Telemetry: Telemetry box 7 applied, CCMD notified. Pt running NSR Assessment: Completed, see docflowsheets Skin: Dry and intact IV: Left AC IV with normal saline running at 18500ml/hr Pain: States no pain at this time Tubes: IV tubing secured Safety Measures: Moderate fall risk Fall Prevention Safety Plan: Reviewed with pt Admission Screening: Completed 6700 Orientation: Patient has been oriented to the unit, staff and to the room. Pt lying comfortably in bed with no needs stated at this time. Call light within reach, will continue to monitor.   Feliciana RossettiLaura Lindsi Bayliss, RN

## 2014-05-10 DIAGNOSIS — D72829 Elevated white blood cell count, unspecified: Secondary | ICD-10-CM

## 2014-05-10 DIAGNOSIS — A419 Sepsis, unspecified organism: Principal | ICD-10-CM

## 2014-05-10 LAB — BASIC METABOLIC PANEL
Anion gap: 10 (ref 5–15)
BUN: 8 mg/dL (ref 6–23)
CO2: 27 mmol/L (ref 19–32)
Calcium: 8.9 mg/dL (ref 8.4–10.5)
Chloride: 104 mmol/L (ref 96–112)
Creatinine, Ser: 0.55 mg/dL (ref 0.50–1.10)
GFR calc Af Amer: >90 mL/min (ref >90)
GFR calc non Af Amer: >90 mL/min (ref >90)
GLUCOSE: 93 mg/dL (ref 70–99)
POTASSIUM: 3.6 mmol/L (ref 3.5–5.1)
Sodium: 141 mmol/L (ref 135–145)

## 2014-05-10 LAB — CBC
HEMATOCRIT: 33.6 % — AB (ref 36.0–46.0)
HEMOGLOBIN: 11.2 g/dL — AB (ref 12.0–15.0)
MCH: 31.4 pg (ref 26.0–34.0)
MCHC: 33.3 g/dL (ref 30.0–36.0)
MCV: 94.1 fL (ref 78.0–100.0)
Platelets: 283 10*3/uL (ref 150–400)
RBC: 3.57 MIL/uL — AB (ref 3.87–5.11)
RDW: 12.4 % (ref 11.5–15.5)
WBC: 5.5 10*3/uL (ref 4.0–10.5)

## 2014-05-10 NOTE — Progress Notes (Signed)
Utilization Review Completed.Felicia Hatfield T3/03/2014  

## 2014-05-10 NOTE — Progress Notes (Signed)
PROGRESS NOTE  Regis Wiland ZOX:[id number] DOB: 1991/01/06 DOA: 05/08/2014 PCP: No primary care provider on file.  Assessment/Plan: 1. Sepsis Blood Cultures , and Urine Culture- e coli IV Rocephin  -recently treated UTI with keflex   2. Pyelonephritis IV Rocephin Adjust PRN Urine Culture Results    3. Leukocytosis resolved    4. Nausea and vomiting Anti-Emetics PRN   Code Status: full Family Communication: father Disposition Plan: home today or tomm   Consultants:    Procedures:      HPI/Subjective: Sleeping, no overnight events Eating well  Objective: Filed Vitals:   05/10/14 0513  BP: 107/66  Pulse: 63  Temp: 98.2 F (36.8 C)  Resp: 16   No intake or output data in the 24 hours ending 05/10/14 0830 Filed Weights   05/09/14 0013 05/09/14 2054  Weight: 68.357 kg (150 lb 11.2 oz) 68.947 kg (152 lb)    Exam:   General:  sleeping  Cardiovascular: rrr  Respiratory: clear  Abdomen: +BS, soft  Musculoskeletal: no edema   Data Reviewed: Basic Metabolic Panel:  Recent Labs Lab 05/08/14 2126 05/09/14 0449  NA 134* 138  K 3.6 3.3*  CL 104 108  CO2 22 24  GLUCOSE 98 91  BUN 11 8  CREATININE 0.69 0.60  CALCIUM 8.9 8.1*   Liver Function Tests: No results for input(s): AST, ALT, ALKPHOS, BILITOT, PROT, ALBUMIN in the last 168 hours. No results for input(s): LIPASE, AMYLASE in the last 168 hours. No results for input(s): AMMONIA in the last 168 hours. CBC:  Recent Labs Lab 05/08/14 2126 05/09/14 0449 05/10/14 0718  WBC 14.2* 9.5 5.5  HGB 12.4 10.5* 11.2*  HCT 36.4 30.8* 33.6*  MCV 91.7 93.1 94.1  PLT 300 249 283   Cardiac Enzymes: No results for input(s): CKTOTAL, CKMB, CKMBINDEX, TROPONINI in the last 168 hours. BNP (last 3 results) No results for input(s): BNP in the  last 8760 hours.  ProBNP (last 3 results) No results for input(s): PROBNP in the last 8760 hours.  CBG: No results for input(s): GLUCAP in the last 168 hours.  Recent Results (from the past 240 hour(s))  Urine culture     Status: None (Preliminary result)   Collection Time: 05/08/14  9:14 PM  Result Value Ref Range Status   Specimen Description URINE, CLEAN CATCH  Final   Special Requests NONE  Final   Colony Count   Final    >=100,000 COLONIES/ML Performed at [facility]    Culture   Final    ESCHERICHIA COLI Performed at [facility]    Report Status PENDING  Incomplete  Blood Culture (routine x 2)     Status: None (Preliminary result)   Collection Time: 05/08/14  9:34 PM  Result Value Ref Range Status   Specimen Description BLOOD RIGHT ARM  Final   Special Requests BOTTLES DRAWN AEROBIC AND ANAEROBIC 5CC EACH  Final   Culture   Final           BLOOD CULTURE RECEIVED NO GROWTH TO DATE CULTURE WILL BE HELD FOR 5 DAYS BEFORE ISSUING A FINAL NEGATIVE REPORT Performed at [facility]    Report Status PENDING  Incomplete  Blood Culture (routine x 2)     Status: None (Preliminary result)   Collection Time: 05/08/14 11:10 PM  Result Value Ref Range Status   Specimen Description BLOOD RIGHT HAND  Final   Special Requests BOTTLES DRAWN AEROBIC ONLY 2CC  Final   Culture  Final           BLOOD CULTURE RECEIVED NO GROWTH TO DATE CULTURE WILL BE HELD FOR 5 DAYS BEFORE ISSUING A FINAL NEGATIVE REPORT [facility]formed at [facility]    Report Status PENDING  Incomplete  Clostridium Difficile by PCR     Status: None   Collection Time: 05/09/14  6:37 AM  Result Value Ref Range Status   C difficile by pcr NEGATIVE NEGATIVE Final     Studies: Dg Chest Port 1 View  05/08/2014   CLINICAL DATA:  Fever and body aches  EXAM: PORTABLE CHEST - 1 VIEW  COMPARISON:  08/15/2013  FINDINGS: Normal heart size and negative mediastinal contours (convexity of the upper  right mediastinum is stable given accentuation by rightward rotation). No acute infiltrate or edema. No effusion or pneumothorax. Thoracic dextroscoliosis. No acute osseous findings.  IMPRESSION: No active disease.   Electronically Signed   By: Marnee SpringJonathon  Watts M.D.   On: 05/08/2014 23:46    Scheduled Meds: . cefTRIAXone (ROCEPHIN)  IV  1 g Intravenous Q24H  . enoxaparin (LOVENOX) injection  40 mg Subcutaneous Q24H  . Influenza vac split quadrivalent PF  0.5 mL Intramuscular Tomorrow-1000  . sodium chloride  3 mL Intravenous Q12H   Continuous Infusions: . sodium chloride 75 mL/hr at 05/09/14 1040   Antibiotics Given (last 72 hours)    Date/Time Action Medication Dose Rate   05/09/14 2358 Given   cefTRIAXone (ROCEPHIN) 1 g in dextrose 5 % 50 mL IVPB - Premix 1 g 100 mL/hr      Principal Problem:   Sepsis Active Problems:   Pyelonephritis   Leukocytosis   Nausea and vomiting    Time spent: 25 min    VANN, JESSICA  [facility] Pager [phone]. If 7PM-7AM, please contact night-coverage at [email or web address], password [email or web address] 05/10/2014, 8:30 AM  LOS: 2 days

## 2014-05-11 ENCOUNTER — Inpatient Hospital Stay (HOSPITAL_COMMUNITY): Payer: BLUE CROSS/BLUE SHIELD

## 2014-05-11 LAB — URINE CULTURE

## 2014-05-11 MED ORDER — IBUPROFEN 400 MG PO TABS
400.0000 mg | ORAL_TABLET | Freq: Once | ORAL | Status: AC
  Administered 2014-05-11: 400 mg via ORAL
  Filled 2014-05-11: qty 1

## 2014-05-11 MED ORDER — SULFAMETHOXAZOLE-TRIMETHOPRIM 800-160 MG PO TABS: 1.0000 | ORAL_TABLET | Freq: Two times a day (BID) | ORAL | Status: DC

## 2014-05-11 MED ORDER — SODIUM CHLORIDE 0.9 % IV BOLUS (SEPSIS)
1000.0000 mL | Freq: Once | INTRAVENOUS | Status: AC
  Administered 2014-05-11: 1000 mL via INTRAVENOUS

## 2014-05-11 NOTE — Care Management Note (Signed)
CARE MANAGEMENT NOTE 05/11/2014  Patient:  Felicia Hatfield,Kellen   Account Number:  [id number]  Date Initiated:  05/11/2014  Documentation initiated by:  Jamarques Pinedo  Subjective/Objective Assessment:   CM following for progression and d/c planning     Action/Plan:   Noted consult re student clinic on [employer]. 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   Anticipated DC Date:  05/12/2014   Anticipated DC Plan:  HOME/SELF CARE         Choice offered to / List presented to:             Status of service:  Completed, signed off Medicare Important Message given?  NO (If response is "NO", the following Medicare IM given date fields will be blank) Date Medicare IM given:   Medicare IM given by:   Date Additional Medicare IM given:   Additional Medicare IM given by:    Discharge Disposition:  HOME/SELF CARE  Per UR Regulation:    If discussed at Long Length of Stay Meetings, dates discussed:    Comments:

## 2014-05-11 NOTE — Progress Notes (Signed)
Discharge instructions and medications discussed with mother and patient. Prescription given to mother.  All questions answered.

## 2014-05-11 NOTE — Progress Notes (Signed)
PROGRESS NOTE  Felicia RoughVashti Hatfield SAY:[id number] DOB: 1990/05/10 DOA: 05/08/2014 PCP: No primary care provider on file.  Assessment/Plan: 1. Sepsis Blood Cultures , and Urine Culture- e coli IV Rocephin  -recently treated UTI with keflex  -change to PO bactrim at d/c   2. Pyelonephritis IV Rocephin Adjust PRN Urine Culture Results   -had episode of fever last Pm- will get renal U/S to r/o abscess (had flu shot yest AM)   3. Leukocytosis resolved    4. Nausea and vomiting Anti-Emetics PRN   Code Status: full Family Communication: father Disposition Plan:    Consultants:    Procedures:      HPI/Subjective: Had fever last night  Objective: Filed Vitals:   05/11/14 0902  BP: 107/52  Pulse: 65  Temp: 98 F (36.7 C)  Resp: 18    Intake/Output Summary (Last 24 hours) at 05/11/14 0953 Last data filed at 05/10/14 1827  Gross per 24 hour  Intake    360 ml  Output      0 ml  Net    360 ml   Filed Weights   05/09/14 0013 05/09/14 2054 05/11/14 0049  Weight: 68.357 kg (150 lb 11.2 oz) 68.947 kg (152 lb) 67.178 kg (148 lb 1.6 oz)    Exam:   General:  Feels ok  This AM- had sore throat last PM  Cardiovascular: rrr  Respiratory: clear  Abdomen: +BS, soft  Musculoskeletal: no edema   Data Reviewed: Basic Metabolic Panel:  Recent Labs Lab 05/08/14 2126 05/09/14 0449 05/10/14 0718  NA 134* 138 141  K 3.6 3.3* 3.6  CL 104 108 104  CO2 22 24 27   GLUCOSE 98 91 93  BUN 11 8 8   CREATININE 0.69 0.60 0.55  CALCIUM 8.9 8.1* 8.9   Liver Function Tests: No results for input(s): AST, ALT, ALKPHOS, BILITOT, PROT, ALBUMIN in the last 168 hours. No results for input(s): LIPASE, AMYLASE in the last 168 hours. No results for input(s): AMMONIA in the last 168 hours. CBC:  Recent Labs Lab 05/08/14 2126  05/09/14 0449 05/10/14 0718  WBC 14.2* 9.5 5.5  HGB 12.4 10.5* 11.2*  HCT 36.4 30.8* 33.6*  MCV 91.7 93.1 94.1  PLT 300 249 283   Cardiac Enzymes: No results for input(s): CKTOTAL, CKMB, CKMBINDEX, TROPONINI in the last 168 hours. BNP (last 3 results) No results for input(s): BNP in the last 8760 hours.  ProBNP (last 3 results) No results for input(s): PROBNP in the last 8760 hours.  CBG: No results for input(s): GLUCAP in the last 168 hours.  Recent Results (from the past 240 hour(s))  Urine culture     Status: None   Collection Time: 05/08/14  9:14 PM  Result Value Ref Range Status   Specimen Description URINE, CLEAN CATCH  Final   Special Requests NONE  Final   Colony Count   Final    >=100,000 COLONIES/ML Performed at [facility]    Culture   Final    ESCHERICHIA COLI Performed at [facility]    Report Status 05/11/2014 FINAL  Final   Organism ID, Bacteria ESCHERICHIA COLI  Final      Susceptibility   Escherichia coli - MIC*    AMPICILLIN <=2 SENSITIVE Sensitive     CEFAZOLIN <=4 SENSITIVE Sensitive     CEFTRIAXONE <=1 SENSITIVE Sensitive     CIPROFLOXACIN <=0.25 SENSITIVE Sensitive     GENTAMICIN <=1 SENSITIVE Sensitive     LEVOFLOXACIN <=0.12 SENSITIVE Sensitive  NITROFURANTOIN <=16 SENSITIVE Sensitive     TOBRAMYCIN <=1 SENSITIVE Sensitive     TRIMETH/SULFA <=20 SENSITIVE Sensitive     PIP/TAZO <=4 SENSITIVE Sensitive     * ESCHERICHIA COLI  Blood Culture (routine x 2)     Status: None (Preliminary result)   Collection Time: 05/08/14  9:34 PM  Result Value Ref Range Status   Specimen Description BLOOD RIGHT ARM  Final   Special Requests BOTTLES DRAWN AEROBIC AND ANAEROBIC 5CC EACH  Final   Culture   Final           BLOOD CULTURE RECEIVED NO GROWTH TO DATE CULTURE WILL BE HELD FOR 5 DAYS BEFORE ISSUING A FINAL NEGATIVE REPORT Performed at [facility]    Report Status PENDING  Incomplete  Blood Culture (routine x 2)      Status: None (Preliminary result)   Collection Time: 05/08/14 11:10 PM  Result Value Ref Range Status   Specimen Description BLOOD RIGHT HAND  Final   Special Requests BOTTLES DRAWN AEROBIC ONLY 2CC  Final   Culture   Final           BLOOD CULTURE RECEIVED NO GROWTH TO DATE CULTURE WILL BE HELD FOR 5 DAYS BEFORE ISSUING A FINAL NEGATIVE REPORT Performed at [facility]    Report Status PENDING  Incomplete  Clostridium Difficile by PCR     Status: None   Collection Time: 05/09/14  6:37 AM  Result Value Ref Range Status   C difficile by pcr NEGATIVE NEGATIVE Final     Studies: No results found.  Scheduled Meds: . cefTRIAXone (ROCEPHIN)  IV  1 g Intravenous Q24H  . enoxaparin (LOVENOX) injection  40 mg Subcutaneous Q24H  . sodium chloride  3 mL Intravenous Q12H   Continuous Infusions:   Antibiotics Given (last 72 hours)    Date/Time Action Medication Dose Rate   05/09/14 2358 Given   cefTRIAXone (ROCEPHIN) 1 g in dextrose 5 % 50 mL IVPB - Premix 1 g 100 mL/hr   05/10/14 2357 Given   cefTRIAXone (ROCEPHIN) 1 g in dextrose 5 % 50 mL IVPB - Premix 1 g 100 mL/hr      Principal Problem:   Sepsis Active Problems:   Pyelonephritis   Leukocytosis   Nausea and vomiting    Time spent: 25 min    Felicia Hatfield  [facility] Pager [phone]. If 7PM-7AM, please contact night-coverage at [email or web address], password [facility] 05/11/2014, 9:53 AM  LOS: 3 days

## 2014-05-12 NOTE — Discharge Summary (Signed)
Physician Discharge Summary  [name] ZOX:[id number] DOB: [birth year] DOA: 05/08/2014  PCP: No primary care provider on file.  Admit date: 05/08/2014 Discharge date: 05/12/2014  Time spent: 35 minutes  Recommendations for Outpatient Follow-up:  1.   Discharge Diagnoses:  Principal Problem:   Sepsis Active Problems:   Pyelonephritis   Leukocytosis   Nausea and vomiting   Discharge Condition: improved  Diet recommendation: regular  Filed Weights   05/09/14 0013 05/09/14 2054 05/11/14 0049  Weight: 68.357 kg (150 lb 11.2 oz) 68.947 kg (152 lb) 67.178 kg (148 lb 1.6 oz)    History of present illness:  Felicia Hatfield is a 24 y.o. female who presents to the ED with complaints of fevers and chills and dysuria and ABD and Flank pain X 2 days. She was seen at and area [facility] and diagnosed with a UTI and was prescribed a 10 day course of Keflex of which she completed 3 days ago. Her symptoms returned 2 days ago. She was evaluated in the ED and found to have an positive UA and Blood and Urine cultures were sent and she was administered IV Rocephin and IV Fluids and referred for admission.  Hospital Course:  Sepsis Blood Cultures NGTD   Urine Culture- e coli IV Rocephin -recently treated UTI with keflex -change to PO bactrim at d/c   Pyelonephritis treated -had episode of fever last Pm- will get renal U/S to r/o abscess (had flu shot yest AM)   3. Leukocytosis resolved    4. Nausea and vomiting Anti-Emetics PRN   Procedures:    Consultations:    Discharge Exam: Filed Vitals:   05/11/14 1800  BP:   Pulse:   Temp: 98.4 F (36.9 C)  Resp:     Discharge Instructions   Discharge Instructions    Diet general    Complete by:  As directed      Discharge instructions    Complete by:  As  directed   Return to ER for fever, n/v     Increase activity slowly    Complete by:  As directed           Discharge Medication List as of 05/11/2014  5:53 PM    START taking these medications   Details  sulfamethoxazole-trimethoprim (BACTRIM DS,SEPTRA DS) 800-160 MG per tablet Take 1 tablet by mouth 2 (two) times daily., Starting 05/11/2014, Until Discontinued, Print      CONTINUE these medications which have NOT CHANGED   Details  acetaminophen (TYLENOL) 500 MG tablet Take 1,000 mg by mouth every 6 (six) hours as needed for moderate pain., Until Discontinued, Historical Med      STOP taking these medications     naproxen sodium (ANAPROX) 220 MG tablet      cephALEXin (KEFLEX) 500 MG capsule        No Known Allergies Follow-up Information    Please follow up.   Why:  can follow up with PCP       The results of significant diagnostics from this hospitalization (including imaging, microbiology, ancillary and laboratory) are listed below for reference.    Significant Diagnostic Studies: US Renal  05/11/2014   CLINICAL DATA:  Fever 1 day.  Pyelonephritis, question abscess.  EXAM: RENAL/URINARY TRACT ULTRASOUND COMPLETE  COMPARISON:  None.  FINDINGS: Right Kidney:  Length: 12.3 cm. Echogenicity within normal limits. No mass or hydronephrosis visualized.  Left Kidney:  Length: 12.0 cm. Echogenicity within normal limits. No mass or hydronephrosis visualized.  Bladder:  Appears  normal for degree of bladder distention.  Kidneys demonstrate normal symmetric vascularity.  IMPRESSION: Normal renal ultrasound.   Electronically Signed   By: Elberta Fortis M.D.   On: 05/11/2014 12:04   Dg Chest Port 1 View  05/08/2014   CLINICAL DATA:  Fever and body aches  EXAM: PORTABLE CHEST - 1 VIEW  COMPARISON:  08/15/2013  FINDINGS: Normal heart size and negative mediastinal contours (convexity of the upper right mediastinum is stable given accentuation by rightward rotation). No acute infiltrate or  edema. No effusion or pneumothorax. Thoracic dextroscoliosis. No acute osseous findings.  IMPRESSION: No active disease.   Electronically Signed   By: Marnee Spring M.D.   On: 05/08/2014 23:46    Microbiology: Recent Results (from the past 240 hour(s))  Urine culture     Status: None   Collection Time: 05/08/14  9:14 PM  Result Value Ref Range Status   Specimen Description URINE, CLEAN CATCH  Final   Special Requests NONE  Final   Colony Count   Final    >=100,000 COLONIES/ML Performed at [facility]    Culture   Final    ESCHERICHIA COLI Performed at [facility]    Report Status 05/11/2014 FINAL  Final   Organism ID, Bacteria ESCHERICHIA COLI  Final      Susceptibility   Escherichia coli - MIC*    AMPICILLIN <=2 SENSITIVE Sensitive     CEFAZOLIN <=4 SENSITIVE Sensitive     CEFTRIAXONE <=1 SENSITIVE Sensitive     CIPROFLOXACIN <=0.25 SENSITIVE Sensitive     GENTAMICIN <=1 SENSITIVE Sensitive     LEVOFLOXACIN <=0.12 SENSITIVE Sensitive     NITROFURANTOIN <=16 SENSITIVE Sensitive     TOBRAMYCIN <=1 SENSITIVE Sensitive     TRIMETH/SULFA <=20 SENSITIVE Sensitive     PIP/TAZO <=4 SENSITIVE Sensitive     * ESCHERICHIA COLI  Blood Culture (routine x 2)     Status: None (Preliminary result)   Collection Time: 05/08/14  9:34 PM  Result Value Ref Range Status   Specimen Description BLOOD RIGHT ARM  Final   Special Requests BOTTLES DRAWN AEROBIC AND ANAEROBIC 5CC EACH  Final   Culture   Final           BLOOD CULTURE RECEIVED NO GROWTH TO DATE CULTURE WILL BE HELD FOR 5 DAYS BEFORE ISSUING A FINAL NEGATIVE REPORT Performed at [facility]    Report Status PENDING  Incomplete  Blood Culture (routine x 2)     Status: None (Preliminary result)   Collection Time: 05/08/14 11:10 PM  Result Value Ref Range Status   Specimen Description BLOOD RIGHT HAND  Final   Special Requests BOTTLES DRAWN AEROBIC ONLY 2CC  Final   Culture   Final           BLOOD CULTURE  RECEIVED NO GROWTH TO DATE CULTURE WILL BE HELD FOR 5 DAYS BEFORE ISSUING A FINAL NEGATIVE REPORT Performed at [facility]    Report Status PENDING  Incomplete  Clostridium Difficile by PCR     Status: None   Collection Time: 05/09/14  6:37 AM  Result Value Ref Range Status   C difficile by pcr NEGATIVE NEGATIVE Final     Labs: Basic Metabolic Panel:  Recent Labs Lab 05/08/14 2126 05/09/14 0449 05/10/14 0718  NA 134* 138 141  K 3.6 3.3* 3.6  CL 104 108 104  CO2 GLUCOSE 98 91 93  BUN 11 8 8  CREATININE 0.69 0.60 0.55  CALCIUM 8.9 8.1* 8.9   Liver Function Tests: No results for input(s): AST, ALT, ALKPHOS, BILITOT, PROT, ALBUMIN in the last 168 hours. No results for input(s): LIPASE, AMYLASE in the last 168 hours. No results for input(s): AMMONIA in the last 168 hours. CBC:  Recent Labs Lab 05/08/14 2126 05/09/14 0449 05/10/14 0718  WBC 14.2* 9.5 5.5  HGB 12.4 10.5* 11.2*  HCT 36.4 30.8* 33.6*  MCV 91.7 93.1 94.1  PLT 300 249 283   Cardiac Enzymes: No results for input(s): CKTOTAL, CKMB, CKMBINDEX, TROPONINI in the last 168 hours. BNP: BNP (last 3 results) No results for input(s): BNP in the last 8760 hours.  ProBNP (last 3 results) No results for input(s): PROBNP in the last 8760 hours.  CBG: No results for input(s): GLUCAP in the last 168 hours.     SignedMarlin Canary:  Melissa Pulido  [facility] 05/12/2014, 12:00 PM

## 2014-05-15 LAB — CULTURE, BLOOD (ROUTINE X 2)
CULTURE: NO GROWTH
Culture: NO GROWTH

## 2014-07-04 DIAGNOSIS — R509 Fever, unspecified: Secondary | ICD-10-CM | POA: Insufficient documentation

## 2014-07-21 ENCOUNTER — Emergency Department (HOSPITAL_COMMUNITY)
Admission: EM | Admit: 2014-07-21 | Discharge: 2014-07-21 | Discharge status: Against Medical Advice | Payer: BLUE CROSS/BLUE SHIELD | Attending: Emergency Medicine | Admitting: Emergency Medicine

## 2014-07-21 ENCOUNTER — Encounter (HOSPITAL_COMMUNITY): Payer: Self-pay

## 2014-07-21 DIAGNOSIS — R1110 Vomiting, unspecified: Secondary | ICD-10-CM | POA: Insufficient documentation

## 2014-07-21 LAB — CBC WITH DIFFERENTIAL/PLATELET
BASOS ABS: 0 10*3/uL (ref 0.0–0.1)
Basophils Relative: 0 % (ref 0–1)
Eosinophils Absolute: 0 10*3/uL (ref 0.0–0.7)
Eosinophils Relative: 0 % (ref 0–5)
HCT: 35.5 % — ABNORMAL LOW (ref 36.0–46.0)
HEMOGLOBIN: 12.1 g/dL (ref 12.0–15.0)
LYMPHS ABS: 1.4 10*3/uL (ref 0.7–4.0)
Lymphocytes Relative: 16 % (ref 12–46)
MCH: 31.3 pg (ref 26.0–34.0)
MCHC: 34.1 g/dL (ref 30.0–36.0)
MCV: 91.7 fL (ref 78.0–100.0)
MONOS PCT: 5 % (ref 3–12)
Monocytes Absolute: 0.5 10*3/uL (ref 0.1–1.0)
NEUTROS PCT: 79 % — AB (ref 43–77)
Neutro Abs: 6.9 10*3/uL (ref 1.7–7.7)
Platelets: 281 10*3/uL (ref 150–400)
RBC: 3.87 MIL/uL (ref 3.87–5.11)
RDW: 12.2 % (ref 11.5–15.5)
WBC: 8.8 10*3/uL (ref 4.0–10.5)

## 2014-07-21 LAB — COMPREHENSIVE METABOLIC PANEL
ALT: 17 U/L (ref 14–54)
ANION GAP: 9 (ref 5–15)
AST: 25 U/L (ref 15–41)
Albumin: 4.3 g/dL (ref 3.5–5.0)
Alkaline Phosphatase: 50 U/L (ref 38–126)
BUN: 10 mg/dL (ref 6–20)
CALCIUM: 8.8 mg/dL — AB (ref 8.9–10.3)
CO2: 24 mmol/L (ref 22–32)
Chloride: 107 mmol/L (ref 101–111)
Creatinine, Ser: 0.54 mg/dL (ref 0.44–1.00)
GFR calc Af Amer: >60 mL/min (ref >60)
GFR calc non Af Amer: >60 mL/min (ref >60)
GLUCOSE: 90 mg/dL (ref 65–99)
Potassium: 3.6 mmol/L (ref 3.5–5.1)
SODIUM: 140 mmol/L (ref 135–145)
TOTAL PROTEIN: 7.7 g/dL (ref 6.5–8.1)
Total Bilirubin: 0.3 mg/dL (ref 0.3–1.2)

## 2014-07-21 LAB — LIPASE, BLOOD: Lipase: 16 U/L — ABNORMAL LOW (ref 22–51)

## 2014-07-21 NOTE — ED Notes (Signed)
Pt came to [facility] and advised she had a paper due and needed to leave.  She has decided to leave AMA.
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
## 2014-07-21 NOTE — ED Notes (Signed)
Pt cannot void at this time 

## 2016-09-28 ENCOUNTER — Encounter: Payer: Self-pay | Admitting: Physician Assistant

## 2016-09-28 ENCOUNTER — Ambulatory Visit (INDEPENDENT_AMBULATORY_CARE_PROVIDER_SITE_OTHER): Payer: 59 | Admitting: Physician Assistant

## 2016-09-28 DIAGNOSIS — S0990XA Unspecified injury of head, initial encounter: Secondary | ICD-10-CM

## 2016-09-28 DIAGNOSIS — S00511A Abrasion of lip, initial encounter: Secondary | ICD-10-CM
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
## 2017-02-06 IMAGING — CR DG CHEST 1V PORT
1 series · 1 of 1 positions shown · non-contrast
Comparison: 08/15/2013

CLINICAL DATA: Fever and body aches

EXAM:
PORTABLE CHEST - 1 VIEW

[AP]
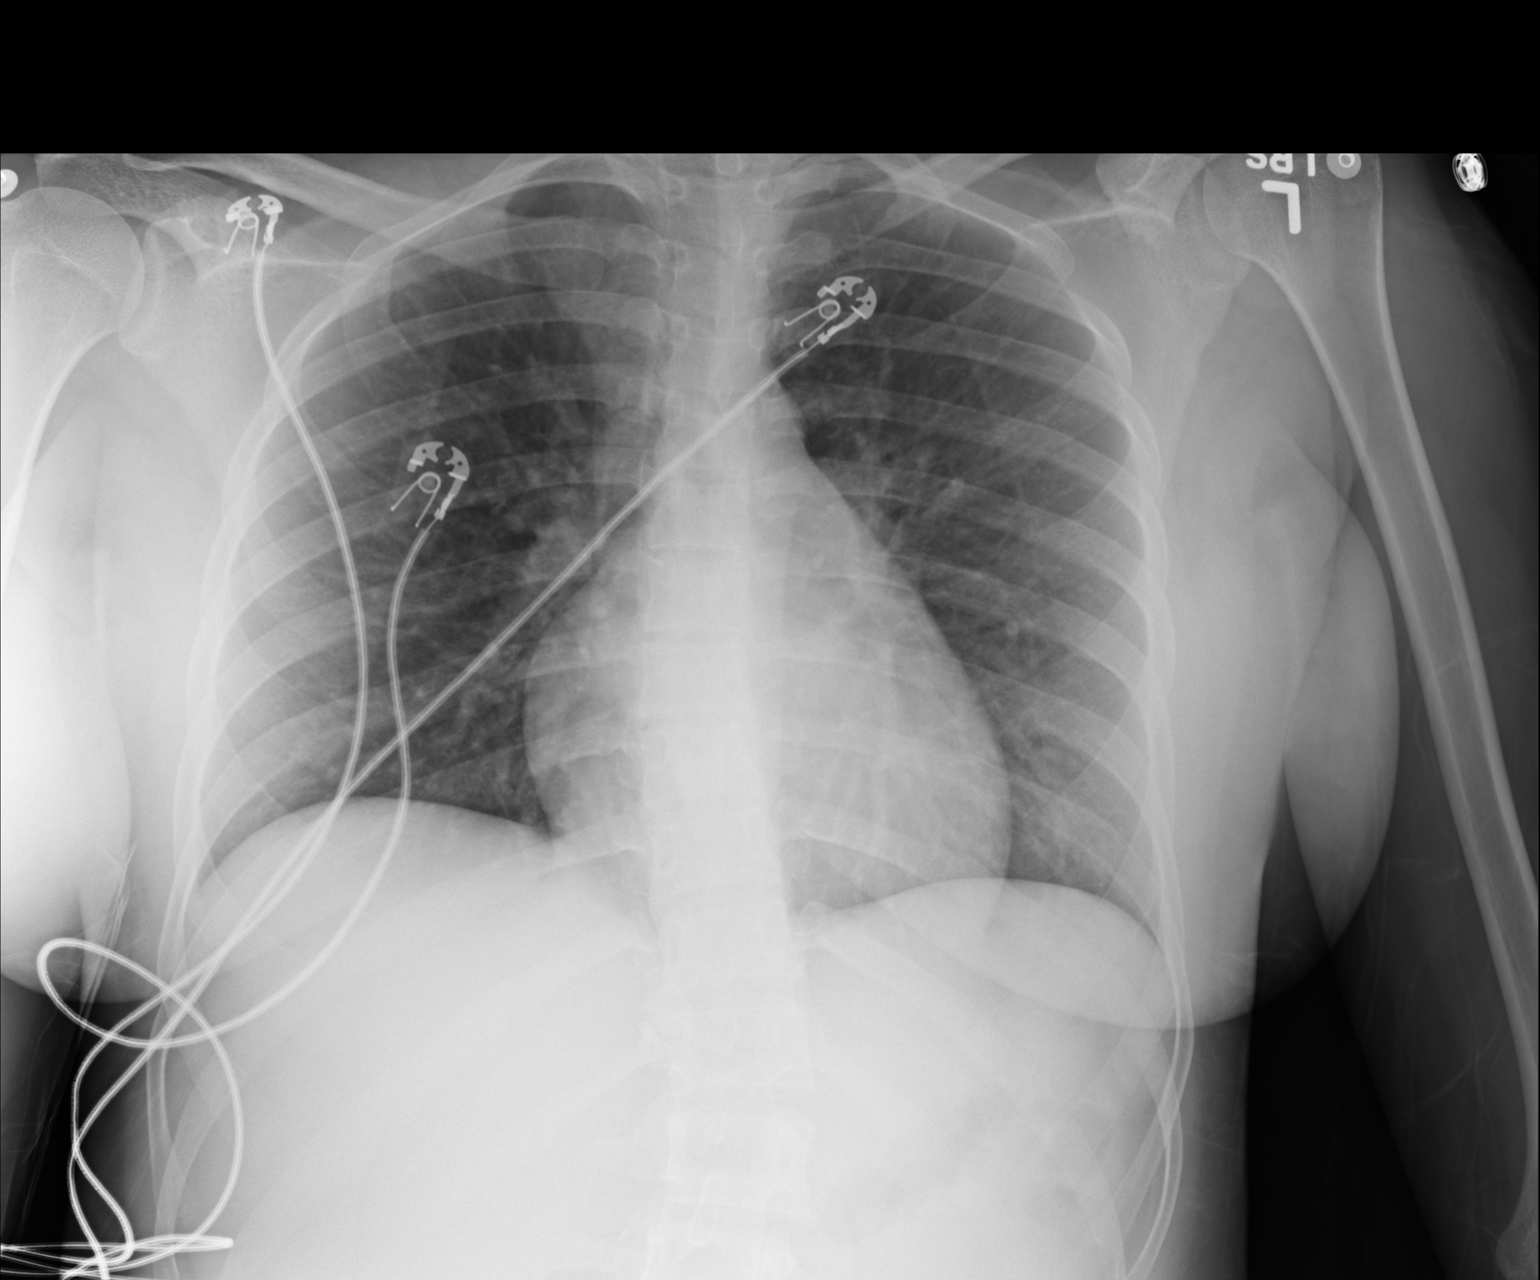

[1 of 1 positions shown; findings below may reference images not displayed]

FINDINGS: Normal heart size and negative mediastinal contours (convexity of
the upper right mediastinum is stable given accentuation by
rightward rotation). No acute infiltrate or edema. No effusion or
pneumothorax. Thoracic dextroscoliosis. No acute osseous findings.
IMPRESSION: No active disease.

## 2017-02-09 IMAGING — US US RENAL
1 series · 14 of 25 positions shown · non-contrast
Comparison: None.

CLINICAL DATA: Fever 1 day.  Pyelonephritis, question abscess.

EXAM:
RENAL/URINARY TRACT ULTRASOUND COMPLETE

[Series 1: us renal · 0.24mm/px · 14 of 32 slices shown]
[im 1/32]
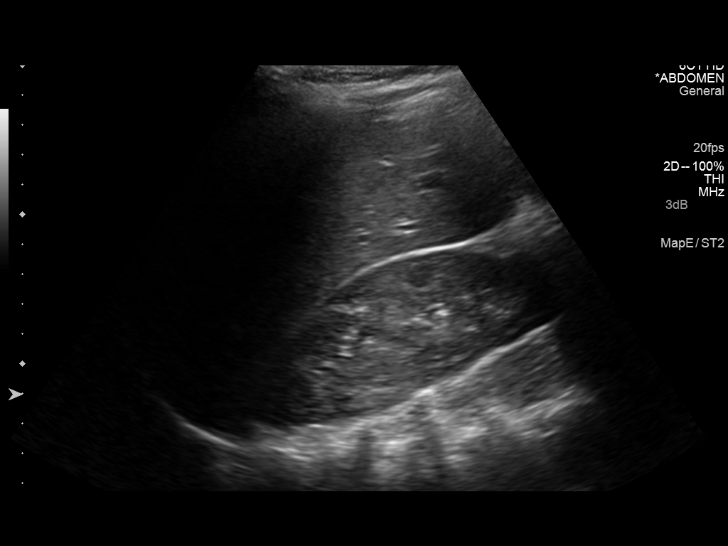
[im 3/32]
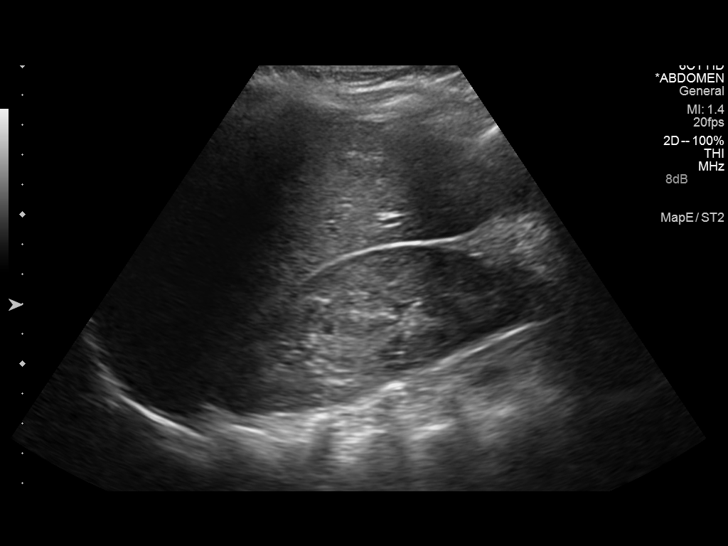
[im 6/32]
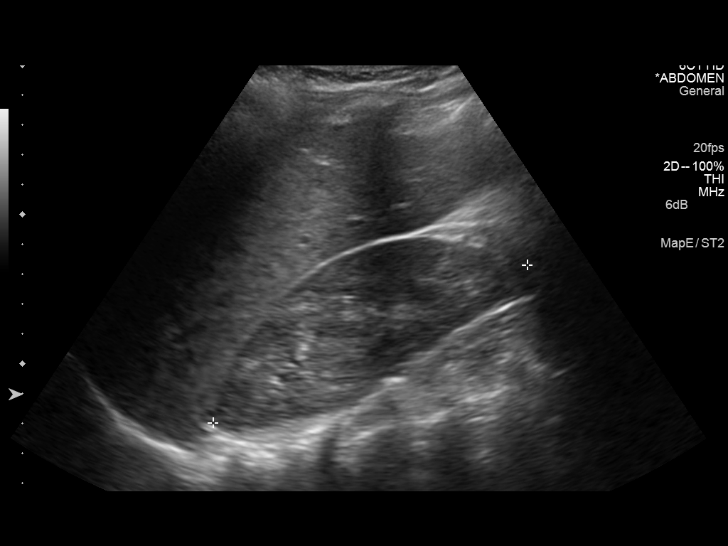
[im 8/32]
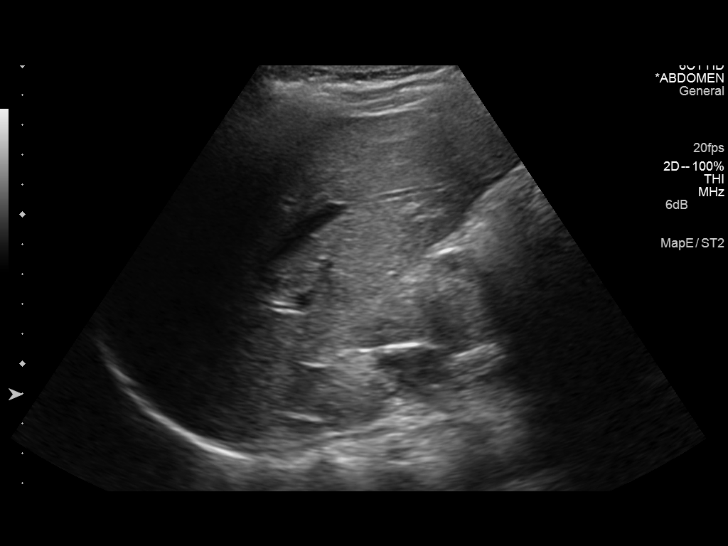
[im 11/32]
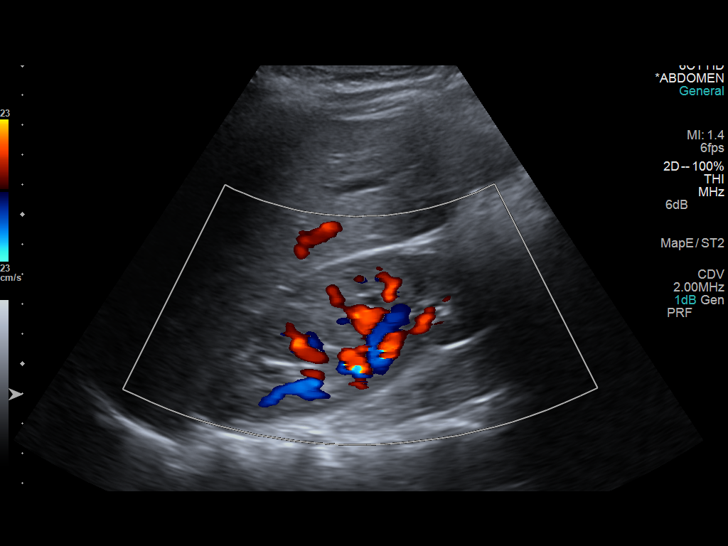
[im 12/32]
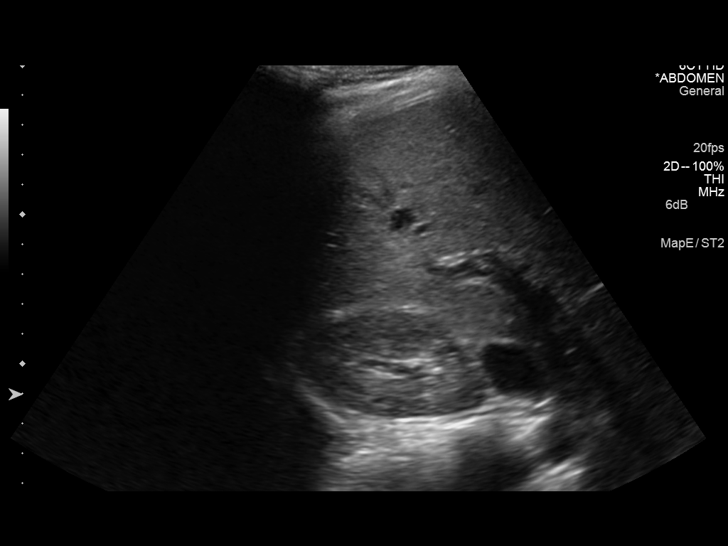
[im 15/32]
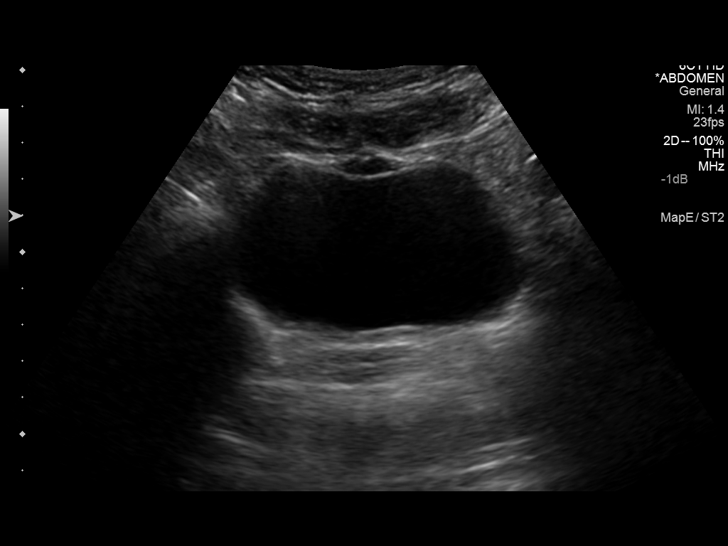
[im 17/32]
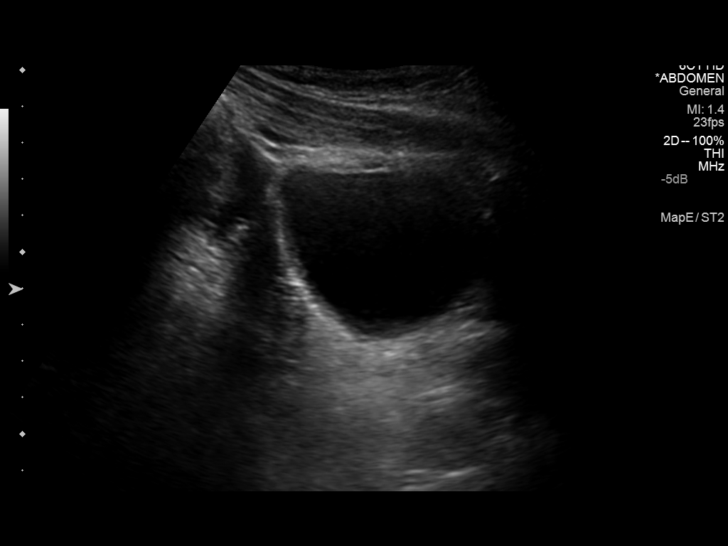
[im 20/32]
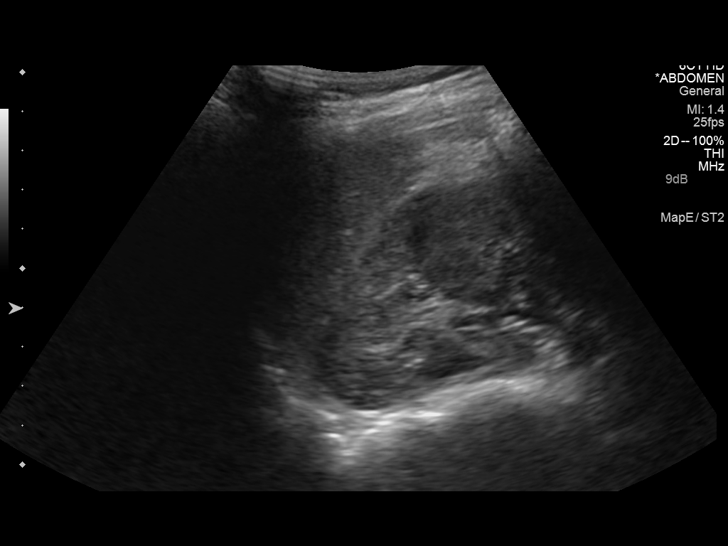
[im 21/32]
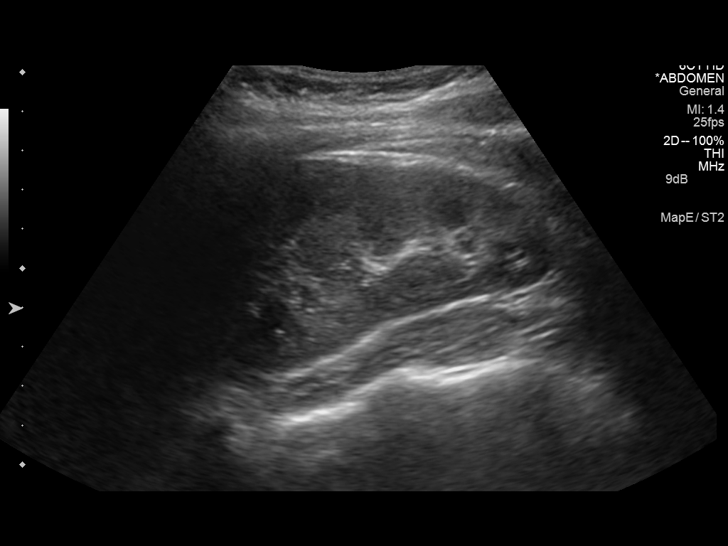
[im 24/32]
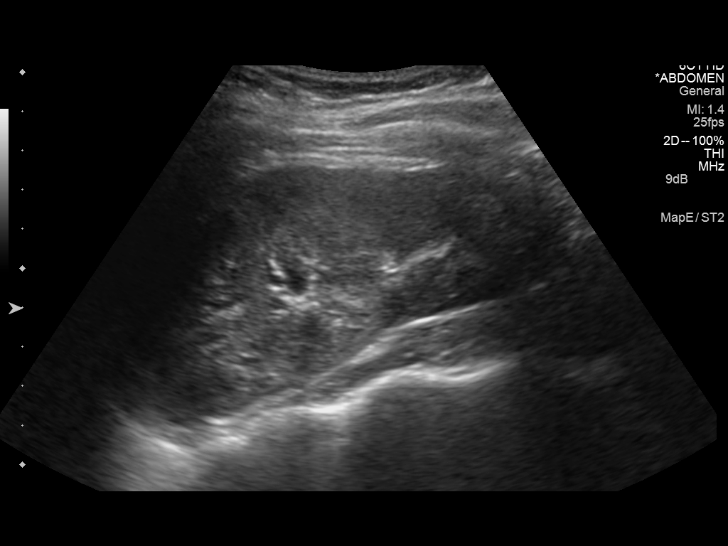
[im 26/32]
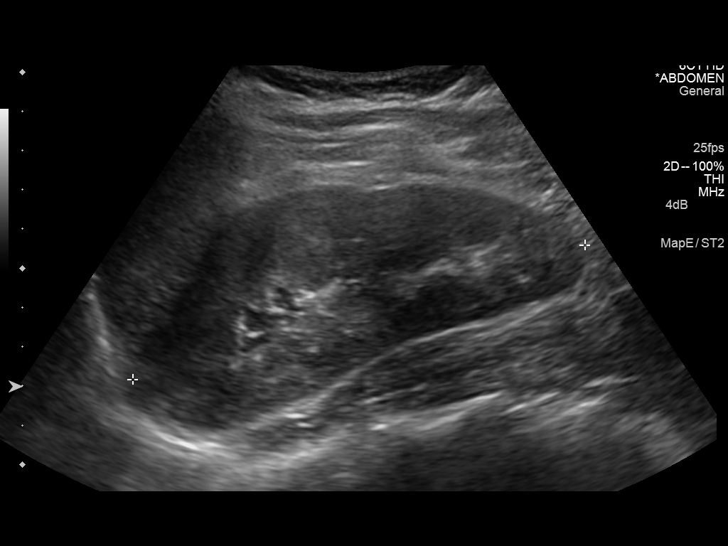
[im 29/32]
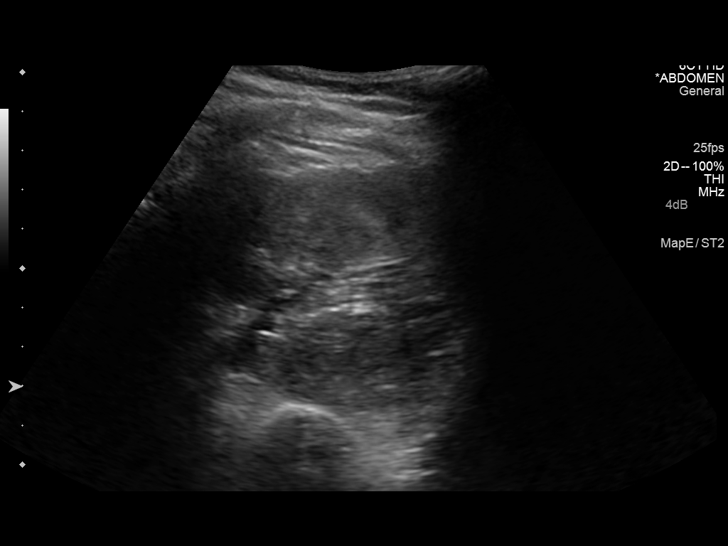
[im 32/32]
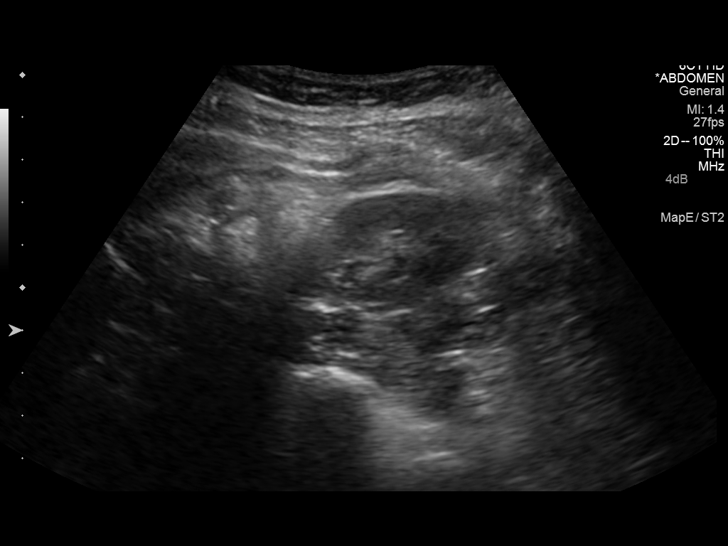

[14 of 25 positions shown; findings below may reference images not displayed]

FINDINGS: Right Kidney:

Length: 12.3 cm. Echogenicity within normal limits. No mass or
hydronephrosis visualized.

Left Kidney:

Length: 12.0 cm. Echogenicity within normal limits. No mass or
hydronephrosis visualized.

Bladder:

Appears normal for degree of bladder distention.

Kidneys demonstrate normal symmetric vascularity.
IMPRESSION: Normal renal ultrasound.

## 2017-02-15 ENCOUNTER — Ambulatory Visit: Payer: Self-pay | Admitting: Nurse Practitioner

## 2017-02-15 ENCOUNTER — Encounter: Payer: Self-pay | Admitting: Nurse Practitioner

## 2017-02-15 VITALS — BP 100/62 | HR 73 | Temp 98.4°F | Wt 159.6 lb

## 2017-02-15 DIAGNOSIS — R350 Frequency of micturition: Secondary | ICD-10-CM

## 2017-02-15 DIAGNOSIS — N390 Urinary tract infection, site not specified: Secondary | ICD-10-CM

## 2017-02-15 DIAGNOSIS — R319 Hematuria, unspecified: Secondary | ICD-10-CM

## 2017-02-15 LAB — POC URINALSYSI DIPSTICK (AUTOMATED)
Bilirubin, UA: NEGATIVE
Glucose, UA: NEGATIVE
Ketones, UA: NEGATIVE
Nitrite, UA: NEGATIVE
UROBILINOGEN UA: NEGATIVE U/dL — AB
pH, UA: 7.5 (ref 5.0–8.0)

## 2017-02-15 LAB — POCT URINE PREGNANCY: PREG TEST UR: NEGATIVE

## 2017-02-15 MED ORDER — PHENAZOPYRIDINE HCL 100 MG PO TABS: 100.0000 mg | ORAL_TABLET | Freq: Three times a day (TID) | ORAL | 0 refills | Status: AC | PRN

## 2017-02-15 MED ORDER — NITROFURANTOIN MONOHYD MACRO 100 MG PO CAPS: 100.0000 mg | ORAL_CAPSULE | Freq: Two times a day (BID) | ORAL | 0 refills | Status: AC

## 2017-02-15 NOTE — Patient Instructions (Addendum)
Dysuria Dysuria is pain or discomfort while urinating. The pain or discomfort may be felt in the tube that carries urine out of the bladder (urethra) or in the surrounding tissue of the genitals. The pain may also be felt in the groin area, lower abdomen, and lower back. You may have to urinate frequently or have the sudden feeling that you have to urinate (urgency). Dysuria can affect both men and women, but is more common in women. Dysuria can be caused by many different things, including:  Urinary tract infection in women.  Infection of the kidney or bladder.  Kidney stones or bladder stones.  Certain sexually transmitted infections (STIs), such as chlamydia.  Dehydration.  Inflammation of the vagina.  Use of certain medicines.  Use of certain soaps or scented products that cause irritation.  Follow these instructions at home: Watch your dysuria for any changes. The following actions may help to reduce any discomfort you are feeling:  Drink enough fluid to keep your urine clear or pale yellow.  Empty your bladder often. Avoid holding urine for long periods of time.  After a bowel movement or urination, women should cleanse from front to back, using each tissue only once.  Empty your bladder after sexual intercourse.  Take medicines only as directed by your health care provider.  If you were prescribed an antibiotic medicine, finish it all even if you start to feel better.  Avoid caffeine, tea, and alcohol. They can irritate the bladder and make dysuria worse. In men, alcohol may irritate the prostate.  Keep all follow-up visits as directed by your health care provider. This is important.  If you had any tests done to find the cause of dysuria, it is your responsibility to obtain your test results. Ask the lab or department performing the test when and how you will get your results. Talk with your health care provider if you have any questions about your results.  Contact a  health care provider if:  You develop pain in your back or sides.  You have a fever.  You have nausea or vomiting.  You have blood in your urine.  You are not urinating as often as you usually do. Get help right away if:  You pain is severe and not relieved with medicines.  You are unable to hold down any fluids.  You or someone else notices a change in your mental function.  You have a rapid heartbeat at rest.  You have shaking or chills.  You feel extremely weak. This information is not intended to replace advice given to you by your health care provider. Make sure you discuss any questions you have with your health care provider. Document Released: 11/24/2003 Document Revised: 08/03/2015 Document Reviewed: 10/21/2013 Elsevier Interactive Patient Education  2018 [employer]  Urinary Tract Infection, Adult A urinary tract infection (UTI) is an infection of any part of the urinary tract, which includes the kidneys, ureters, bladder, and urethra. These organs make, store, and get rid of urine in the body. UTI can be a bladder infection (cystitis) or kidney infection (pyelonephritis). What are the causes? This infection may be caused by fungi, viruses, or bacteria. Bacteria are the most common cause of UTIs. This condition can also be caused by repeated incomplete emptying of the bladder during urination. What increases the risk? This condition is more likely to develop if:  You ignore your need to urinate or hold urine for long periods of time.  You do not empty your  bladder completely during urination.  You wipe back to front after urinating or having a bowel movement, if you are female.  You are uncircumcised, if you are female.  You are constipated.  You have a urinary catheter that stays in place (indwelling).  You have a weak defense (immune) system.  You have a medical condition that affects your bowels, kidneys, or bladder.  You have diabetes.  You take  antibiotic medicines frequently or for long periods of time, and the antibiotics no longer work well against certain types of infections (antibiotic resistance).  You take medicines that irritate your urinary tract.  You are exposed to chemicals that irritate your urinary tract.  You are female.  What are the signs or symptoms? Symptoms of this condition include:  Fever.  Frequent urination or passing small amounts of urine frequently.  Needing to urinate urgently.  Pain or burning with urination.  Urine that smells bad or unusual.  Cloudy urine.  Pain in the lower abdomen or back.  Trouble urinating.  Blood in the urine.  Vomiting or being less hungry than normal.  Diarrhea or abdominal pain.  Vaginal discharge, if you are female.  How is this diagnosed? This condition is diagnosed with a medical history and physical exam. You will also need to provide a urine sample to test your urine. Other tests may be done, including:  Blood tests.  Sexually transmitted disease (STD) testing.  If you have had more than one UTI, a cystoscopy or imaging studies may be done to determine the cause of the infections. How is this treated? Treatment for this condition often includes a combination of two or more of the following:  Antibiotic medicine.  Other medicines to treat less common causes of UTI.  Over-the-counter medicines to treat pain.  Drinking enough water to stay hydrated.  Follow these instructions at home:  Take over-the-counter and prescription medicines only as told by your health care provider.  If you were prescribed an antibiotic, take it as told by your health care provider. Do not stop taking the antibiotic even if you start to feel better.  Avoid alcohol, caffeine, tea, and carbonated beverages. They can irritate your bladder.  Drink enough fluid to keep your urine clear or pale yellow.  Keep all follow-up visits as told by your health care provider.  This is important.  Make sure to: ? Empty your bladder often and completely. Do not hold urine for long periods of time. ? Empty your bladder before and after sex. ? Wipe from front to back after a bowel movement if you are female. Use each tissue one time when you wipe. Contact a health care provider if:  You have back pain.  You have a fever.  You feel nauseous or vomit.  Your symptoms do not get better after 3 days.  Your symptoms go away and then return. Get help right away if:  You have severe back pain or lower abdominal pain.  You are vomiting and cannot keep down any medicines or water. This information is not intended to replace advice given to you by your health care provider. Make sure you discuss any questions you have with your health care provider. Document Released: 12/05/2004 Document Revised: 08/09/2015 Document Reviewed: 01/16/2015 Elsevier Interactive Patient Education  2017 [employer]

## 2017-02-15 NOTE — Progress Notes (Signed)
Subjective:    Felicia Hatfield is a 26 y.o. female who complains of dysuria, frequency, hematuria and nausea for 1 day.  Patient also complains of back pain. Patient denies fever, headache, rhinitis, sorethroat and vaginal discharge.  Patient does have a history of UTI.  Patient does have a history of pyelonephritis that required hospitalization previously.  Patient has taken Ibuprofen for pain with relief. Patient's menstrual period is approximately 1.5 weeks late, patient states she also took a test at home which was negative.    The following portions of the patient's history were reviewed and updated as appropriate: allergies, current medications and past medical history. Review of Systems Constitutional: negative Eyes: negative Ears, nose, mouth, throat, and face: negative Respiratory: negative Cardiovascular: negative Gastrointestinal: positive for abdominal pain and nausea Genitourinary:positive for dysuria, frequency and hematuria, negative for decreased stream, hesitancy and urinary incontinence Behavioral/Psych: negative    Objective:    BP 100/62   Pulse 73   Temp 98.4 F (36.9 C)   Wt 159 lb 9.6 oz (72.4 kg)   SpO2 100%   BMI 27.58 kg/m  General: alert, cooperative and no distress  Abdomen: soft, nondistended, normal bowel sounds and tenderness mild in the LUQ in the LUQ  Back: back muscles are full ROM, CVA tenderness absent  GU: defer exam   Laboratory:  Urine dipstick shows sp gravity 1.006, negative for glucose, large for hemoglobin, negative for ketones, large for leukocyte esterase, negative for nitrites, trace for protein and negative for urobilinogen.   Micro exam: not done.    Assessment:    Acute Cystitis with Hematuria    Plan:    1. Medications: Macrobid and Pyridium 2. Maintain adequate hydration 3. Follow up if symptoms not improving, and prn.   4. Patient instructed to go to ER if fever, increasing back pain, increasing abdominal pain or other  concerns. 5. Patient instructed to urinate at least 30 minutes after sexual intercourse. 6. May use cranberry juice.

## 2017-02-18 ENCOUNTER — Telehealth: Payer: Self-pay | Admitting: Nurse Practitioner

## 2017-02-18 NOTE — Telephone Encounter (Signed)
Called patient on both home/cell phone to follow up on her condition.  Unable to leave message.

## 2017-04-19 ENCOUNTER — Other Ambulatory Visit: Payer: Self-pay

## 2017-04-19 ENCOUNTER — Emergency Department (HOSPITAL_COMMUNITY): Payer: 59

## 2017-04-19 ENCOUNTER — Emergency Department (HOSPITAL_COMMUNITY)
Admission: EM | Admit: 2017-04-19 | Discharge: 2017-04-19 | Disposition: A | Discharge status: Home / Self Care | Payer: 59 | Attending: Emergency Medicine | Admitting: Emergency Medicine

## 2017-04-19 ENCOUNTER — Encounter (HOSPITAL_COMMUNITY): Payer: Self-pay

## 2017-04-19 DIAGNOSIS — N83201 Unspecified ovarian cyst, right side: Secondary | ICD-10-CM | POA: Insufficient documentation

## 2017-04-19 DIAGNOSIS — R102 Pelvic and perineal pain: Secondary | ICD-10-CM | POA: Diagnosis present

## 2017-04-19 LAB — URINALYSIS, ROUTINE W REFLEX MICROSCOPIC
BILIRUBIN URINE: NEGATIVE
Glucose, UA: NEGATIVE mg/dL
Hgb urine dipstick: NEGATIVE
Ketones, ur: 5 mg/dL — AB
LEUKOCYTES UA: NEGATIVE
Nitrite: NEGATIVE
PH: 5 (ref 5.0–8.0)
Protein, ur: NEGATIVE mg/dL
Specific Gravity, Urine: 1.033 — ABNORMAL HIGH (ref 1.005–1.030)

## 2017-04-19 LAB — CBC WITH DIFFERENTIAL/PLATELET
Basophils Absolute: 0 10*3/uL (ref 0.0–0.1)
Basophils Relative: 0 %
EOS ABS: 0.1 10*3/uL (ref 0.0–0.7)
Eosinophils Relative: 0 %
HEMATOCRIT: 34.7 % — AB (ref 36.0–46.0)
HEMOGLOBIN: 11.7 g/dL — AB (ref 12.0–15.0)
LYMPHS ABS: 1.7 10*3/uL (ref 0.7–4.0)
LYMPHS PCT: 15 %
MCH: 31.1 pg (ref 26.0–34.0)
MCHC: 33.7 g/dL (ref 30.0–36.0)
MCV: 92.3 fL (ref 78.0–100.0)
Monocytes Absolute: 0.8 10*3/uL (ref 0.1–1.0)
Monocytes Relative: 7 %
NEUTROS ABS: 8.6 10*3/uL — AB (ref 1.7–7.7)
NEUTROS PCT: 78 %
Platelets: 264 10*3/uL (ref 150–400)
RBC: 3.76 MIL/uL — AB (ref 3.87–5.11)
RDW: 12 % (ref 11.5–15.5)
WBC: 11.1 10*3/uL — AB (ref 4.0–10.5)

## 2017-04-19 LAB — WET PREP, GENITAL
Clue Cells Wet Prep HPF POC: NONE SEEN
SPERM: NONE SEEN
Trich, Wet Prep: NONE SEEN
WBC WET PREP: NONE SEEN
Yeast Wet Prep HPF POC: NONE SEEN

## 2017-04-19 LAB — BASIC METABOLIC PANEL
Anion gap: 9 (ref 5–15)
BUN: 11 mg/dL (ref 6–20)
CHLORIDE: 106 mmol/L (ref 101–111)
CO2: 23 mmol/L (ref 22–32)
Calcium: 8.9 mg/dL (ref 8.9–10.3)
Creatinine, Ser: 0.6 mg/dL (ref 0.44–1.00)
GFR calc non Af Amer: >60 mL/min (ref >60)
Glucose, Bld: 95 mg/dL (ref 65–99)
Potassium: 3.6 mmol/L (ref 3.5–5.1)
Sodium: 138 mmol/L (ref 135–145)

## 2017-04-19 LAB — I-STAT BETA HCG BLOOD, ED (MC, WL, AP ONLY): I-stat hCG, quantitative: <5 m[IU]/mL (ref <5)

## 2017-04-19 MED ORDER — PROMETHAZINE HCL 25 MG/ML IJ SOLN: 25.0000 mg | Freq: Once | INTRAMUSCULAR | Status: DC

## 2017-04-19 NOTE — ED Provider Notes (Signed)
[facility] EMERGENCY DEPARTMENT Provider Note   CSN: [id number] Arrival date & time: 04/19/17  1138     History   Chief Complaint Chief Complaint  Patient presents with  . Abdominal Pain    HPI Felicia Hatfield is a 27 y.o. female.  HPI  Patient presenting with right-sided pelvic pain.  She states she saw her gynecologist this week and was diagnosed with a ruptured cyst.  She also had a pelvic ultrasound and pelvic exam.  She states she was given an anti-inflammatory medication.  She comes today for another check of the pelvic pain because she is surprised she is still having pain and her gynecologist told her she did not know why she would be in pain.  She had one day of scant vaginal bleeding which is now resolved.  She has had no fever or vomiting.  States the medication she was provided does help with the pain but this morning she did not take it and therefore is having pain and would like to be checked to find out why.  There are no other associated systemic symptoms, there are no other alleviating or modifying factors.   History reviewed. No pertinent past medical history.  Patient Active Problem List   Diagnosis Date Noted  . Fever   . Leukocytosis 05/09/2014  . Nausea and vomiting 05/09/2014  . Pyelonephritis 05/08/2014  . Sepsis (HCC) 05/08/2014    History reviewed. No pertinent surgical history.  OB History    No data available       Home Medications    Prior to Admission medications   Not on File    Family History History reviewed. No pertinent family history.  Social History Social History   Tobacco Use  . Smoking status: Never Smoker  . Smokeless tobacco: Never Used  Substance Use Topics  . Alcohol use: Yes  . Drug use: No     Allergies   Patient has no known allergies.   Review of Systems Review of Systems  ROS reviewed and all otherwise negative except for mentioned in HPI  Physical Exam Updated Vital Signs BP  101/73   Pulse 70   Temp 98.7 F (37.1 C) (Oral)   Resp 18   LMP 03/14/2017 (Approximate)   SpO2 97%  Vitals reviewed Physical Exam  Physical Examination: General appearance - alert, well appearing, and in no distress Mental status - alert, oriented to person, place, and time Eyes - no conjunctival injection, no scleral icterus Chest - clear to auscultation, no wheezes, rales or rhonchi, symmetric air entry Heart - normal rate, regular rhythm, normal S1, S2, no murmurs, rubs, clicks or gallops Abdomen - soft, nontender, nondistended, no masses or organomegaly Pelvic - normal external genitalia, vulva, vagina, cervix, uterus and adnexa, no CMT, no adnexal tenderness Neurological - alert, oriented, normal speech, no focal findings or movement disorder noted Extremities - peripheral pulses normal, no pedal edema, no clubbing or cyanosis Skin - normal coloration and turgor, no rashes,    ED Treatments / Results  Labs (all labs ordered are listed, but only abnormal results are displayed) Labs Reviewed  URINALYSIS, ROUTINE W REFLEX MICROSCOPIC - Abnormal; Notable for the following components:      Result Value   APPearance HAZY (*)    Specific Gravity, Urine 1.033 (*)    Ketones, ur 5 (*)    All other components within normal limits  CBC WITH DIFFERENTIAL/PLATELET - Abnormal; Notable for the following components:   WBC 11.1 (*)  RBC 3.76 (*)    Hemoglobin 11.7 (*)    HCT 34.7 (*)    Neutro Abs 8.6 (*)    All other components within normal limits  WET PREP, GENITAL  BASIC METABOLIC PANEL  I-STAT BETA HCG BLOOD, ED ([facility], [facility], [facility] ONLY)  GC/CHLAMYDIA PROBE AMP ([place]) NOT AT [facility]    EKG  EKG Interpretation None       Radiology US Pelvis Transvanginal Non-ob (tv Only)  Result Date: 04/19/2017 CLINICAL DATA:  Right pelvic pain for 1 week EXAM: TRANSABDOMINAL AND TRANSVAGINAL ULTRASOUND OF PELVIS DOPPLER ULTRASOUND OF OVARIES TECHNIQUE: Both transabdominal and  transvaginal ultrasound examinations of the pelvis were performed. Transabdominal technique was performed for global imaging of the pelvis including uterus, ovaries, adnexal regions, and pelvic cul-de-sac. It was necessary to proceed with endovaginal exam following the transabdominal exam to visualize the uterus, endometrium, ovaries and adnexa. Color and duplex Doppler ultrasound was utilized to evaluate blood flow to the ovaries. COMPARISON:  None FINDINGS: Uterus Measurements: 7.7 x 3.9 x 4.6 cm.  No focal abnormality Endometrium Thickness: Normal thickness, 9 mm.  No focal abnormality visualized. Right ovary Measurements: 4.4 x 2.3 x 3.1 cm. Small collapsing cyst or follicle measures 1.4 cm maximally. No adnexal mass. Left ovary Measurements: 3.5 x 2.0 x 3.2 cm. Normal appearance/no adnexal mass. Pulsed Doppler evaluation of both ovaries demonstrates normal low-resistance arterial and venous waveforms. Other findings No abnormal free fluid. IMPRESSION: Small collapsing cyst or follicle in the right ovary. No ovarian or adnexal abnormality. No torsion. Electronically Signed   By: Charlett Nose M.D.   On: 04/19/2017 14:45   US Pelvis Complete  Result Date: 04/19/2017 CLINICAL DATA:  Right pelvic pain for 1 week EXAM: TRANSABDOMINAL AND TRANSVAGINAL ULTRASOUND OF PELVIS DOPPLER ULTRASOUND OF OVARIES TECHNIQUE: Both transabdominal and transvaginal ultrasound examinations of the pelvis were performed. Transabdominal technique was performed for global imaging of the pelvis including uterus, ovaries, adnexal regions, and pelvic cul-de-sac. It was necessary to proceed with endovaginal exam following the transabdominal exam to visualize the uterus, endometrium, ovaries and adnexa. Color and duplex Doppler ultrasound was utilized to evaluate blood flow to the ovaries. COMPARISON:  None FINDINGS: Uterus Measurements: 7.7 x 3.9 x 4.6 cm.  No focal abnormality Endometrium Thickness: Normal thickness, 9 mm.  No focal  abnormality visualized. Right ovary Measurements: 4.4 x 2.3 x 3.1 cm. Small collapsing cyst or follicle measures 1.4 cm maximally. No adnexal mass. Left ovary Measurements: 3.5 x 2.0 x 3.2 cm. Normal appearance/no adnexal mass. Pulsed Doppler evaluation of both ovaries demonstrates normal low-resistance arterial and venous waveforms. Other findings No abnormal free fluid. IMPRESSION: Small collapsing cyst or follicle in the right ovary. No ovarian or adnexal abnormality. No torsion. Electronically Signed   By: Charlett Nose M.D.   On: 04/19/2017 14:45   US Pelvic Doppler (torsion R/o Or Mass Arterial Flow)  Result Date: 04/19/2017 CLINICAL DATA:  Right pelvic pain for 1 week EXAM: TRANSABDOMINAL AND TRANSVAGINAL ULTRASOUND OF PELVIS DOPPLER ULTRASOUND OF OVARIES TECHNIQUE: Both transabdominal and transvaginal ultrasound examinations of the pelvis were performed. Transabdominal technique was performed for global imaging of the pelvis including uterus, ovaries, adnexal regions, and pelvic cul-de-sac. It was necessary to proceed with endovaginal exam following the transabdominal exam to visualize the uterus, endometrium, ovaries and adnexa. Color and duplex Doppler ultrasound was utilized to evaluate blood flow to the ovaries. COMPARISON:  None FINDINGS: Uterus Measurements: 7.7 x 3.9 x 4.6 cm.  No focal abnormality Endometrium  Thickness: Normal thickness, 9 mm.  No focal abnormality visualized. Right ovary Measurements: 4.4 x 2.3 x 3.1 cm. Small collapsing cyst or follicle measures 1.4 cm maximally. No adnexal mass. Left ovary Measurements: 3.5 x 2.0 x 3.2 cm. Normal appearance/no adnexal mass. Pulsed Doppler evaluation of both ovaries demonstrates normal low-resistance arterial and venous waveforms. Other findings No abnormal free fluid. IMPRESSION: Small collapsing cyst or follicle in the right ovary. No ovarian or adnexal abnormality. No torsion. Electronically Signed   By: Charlett NoseKevin  Dover M.D.   On: 04/19/2017  14:45    Procedures Procedures (including critical care time)  Medications Ordered in ED Medications - No data to display   Initial Impression / Assessment and Plan / ED Course  I have reviewed the triage vital signs and the nursing notes.  Pertinent labs & imaging results that were available during my care of the patient were reviewed by me and considered in my medical decision making (see chart for details).    Patient presenting with complaint of ongoing right-sided pelvic pain.  She has a known cyst on her ovary.  She continues to have ongoing pain and had one day of vaginal bleeding.  Pelvic exam is normal in the ED no CMT redness redness and no ongoing vaginal bleeding.  Pelvic ultrasound shows right ovarian cyst.  No sign of torsion or tubo-ovarian abscess.  Patient advised to follow-up with her gynecologist and to continue the pain medication she was given by the gynecologist.  Discharged with strict return precautions.  Pt agreeable with plan.   Final Clinical Impressions(s) / ED Diagnoses   Final diagnoses:  Pelvic pain  Cyst of right ovary    ED Discharge Orders    None       Phillis HaggisMabe, Regena Delucchi L, MD 04/19/17 [phone]

## 2017-04-19 NOTE — ED Notes (Signed)
Patient transported to Ultrasound 

## 2017-04-19 NOTE — Discharge Instructions (Signed)
Return to the ED with any concerns including vomiting and not able to keep down liquids or your medications, abdominal pain especially if it localizes to the right lower abdomen, fever or chills, and decreased urine output, decreased level of alertness or lethargy, or any other alarming symptoms.  °

## 2017-04-19 NOTE — ED Triage Notes (Signed)
Pt presents with 1 week h/o RLQ abdominal pain.  Pt reports she went to OB/GYN and was told she had an ovarian cyst that ruptured, had vaginal bleeding yesterday.  Pt reports medication given will relieve pain.

## 2017-04-21 LAB — GC/CHLAMYDIA PROBE AMP (~~LOC~~) NOT AT ARMC
Chlamydia: NEGATIVE
NEISSERIA GONORRHEA: NEGATIVE

## 2017-06-10 ENCOUNTER — Encounter: Payer: Self-pay | Admitting: Physician Assistant

## 2017-12-13 ENCOUNTER — Encounter (HOSPITAL_COMMUNITY): Payer: Self-pay

## 2017-12-13 ENCOUNTER — Ambulatory Visit (HOSPITAL_COMMUNITY)
Admission: EM | Admit: 2017-12-13 | Discharge: 2017-12-13 | Disposition: A | Discharge status: Home / Self Care | Payer: 59 | Attending: Family Medicine | Admitting: Family Medicine

## 2017-12-13 DIAGNOSIS — Z3202 Encounter for pregnancy test, result negative: Secondary | ICD-10-CM | POA: Diagnosis not present

## 2017-12-13 DIAGNOSIS — N939 Abnormal uterine and vaginal bleeding, unspecified: Secondary | ICD-10-CM | POA: Diagnosis present

## 2017-12-13 DIAGNOSIS — N760 Acute vaginitis: Secondary | ICD-10-CM

## 2017-12-13 DIAGNOSIS — R102 Pelvic and perineal pain: Secondary | ICD-10-CM | POA: Diagnosis present

## 2017-12-13 LAB — POCT URINALYSIS DIP (DEVICE)
BILIRUBIN URINE: NEGATIVE
GLUCOSE, UA: NEGATIVE mg/dL
HGB URINE DIPSTICK: NEGATIVE
Ketones, ur: NEGATIVE mg/dL
LEUKOCYTES UA: NEGATIVE
NITRITE: NEGATIVE
Protein, ur: NEGATIVE mg/dL
Specific Gravity, Urine: 1.02 (ref 1.005–1.030)
UROBILINOGEN UA: 0.2 mg/dL (ref 0.0–1.0)
pH: 7 (ref 5.0–8.0)

## 2017-12-13 LAB — POCT PREGNANCY, URINE: Preg Test, Ur: NEGATIVE

## 2017-12-13 MED ORDER — METRONIDAZOLE 500 MG PO TABS: 500.0000 mg | ORAL_TABLET | Freq: Two times a day (BID) | ORAL | 0 refills | Status: AC

## 2017-12-13 NOTE — Discharge Instructions (Signed)
We are starting treatment for BV today.  Complete course of antibiotics. Don't drink alcohol while taking.  A probiotic may also be helpful.  I would avoid having sex until symptoms have resolved.  Will notify you of any positive findings from your vaginal swab and if any changes to treatment are needed.   If symptoms worsen or do not improve in the next week to return to be seen or to follow up with PCP or gynecologist.

## 2017-12-13 NOTE — ED Triage Notes (Signed)
Pt presents with ongoing dull pelvic pain and vaginal spots of blood.

## 2017-12-13 NOTE — ED Provider Notes (Signed)
[facility]    CSN: [id number] Arrival date & time: 12/13/17  1001     History   Chief Complaint Chief Complaint  Patient presents with  . Vaginal Bleeding  . Pelvic Pain    HPI Felicia Hatfield is a 27 y.o. female.   Indigo presents with complaints of vaginal discharge and odor which started a few days ago. States was treated for BV a few weeks ago but stopped taking the antibiotics. Symptoms had improved. States noted small amount of spotting with wiping recently. No other vaginal bleeding. LMP 9/9, states she is trying for pregnancy. No urinary symptoms. States she has some intermittent abdominal pain near her umbilicus which has been going for a while now, worse if she lays on her stomach. Occasional nausea which is not new for her. Her stools have been loose, has approximately 1-2 a day. No blood in stool. No back pain. No fevers. Follows with an ob/gyn. Without contributing medical history.      ROS per HPI.      History reviewed. No pertinent past medical history.  Patient Active Problem List   Diagnosis Date Noted  . Fever   . Leukocytosis 05/09/2014  . Nausea and vomiting 05/09/2014  . Pyelonephritis 05/08/2014  . Sepsis (HCC) 05/08/2014    History reviewed. No pertinent surgical history.  OB History   None      Home Medications    Prior to Admission medications   Medication Sig Start Date End Date Taking? Authorizing Provider  metroNIDAZOLE (FLAGYL) 500 MG tablet Take 1 tablet (500 mg total) by mouth 2 (two) times daily for 7 days. 12/13/17 12/20/17  Georgetta Haber, NP    Family History History reviewed. No pertinent family history.  Social History Social History   Tobacco Use  . Smoking status: Never Smoker  . Smokeless tobacco: Never Used  Substance Use Topics  . Alcohol use: Yes  . Drug use: No     Allergies   Patient has no known allergies.   Review of Systems Review of Systems   Physical Exam Triage Vital  Signs ED Triage Vitals [12/13/17 1027]  Enc Vitals Group     BP (!) 117/59     Pulse Rate 71     Resp 20     Temp 98.4 F (36.9 C)     Temp Source Oral     SpO2 93 %     Weight      Height      Head Circumference      Peak Flow      Pain Score      Pain Loc      Pain Edu?      Excl. in GC?    No data found.  Updated Vital Signs BP (!) 117/59 (BP Location: Left Arm)   Pulse 71   Temp 98.4 F (36.9 C) (Oral)   Resp 20   LMP 11/17/2017   SpO2 93%    Physical Exam  Constitutional: She is oriented to person, place, and time. She appears well-developed and well-nourished. No distress.  Cardiovascular: Normal rate, regular rhythm and normal heart sounds.  Pulmonary/Chest: Effort normal and breath sounds normal.  Abdominal: Soft. There is no rigidity, no rebound, no guarding and no CVA tenderness.  Mild periumbilical tenderness on deep palpation; no palpable hernia; no rebound tenderness, rigidity or guarding  Genitourinary: Uterus normal. Cervix exhibits discharge. Cervix exhibits no motion tenderness. Right adnexum displays no mass and no tenderness.  Left adnexum displays no mass and no tenderness. No tenderness or bleeding in the vagina. Vaginal discharge found.  Genitourinary Comments: Large amount of white creamy thick vaginal discharge on exam, no visible bleeding   Neurological: She is alert and oriented to person, place, and time.  Skin: Skin is warm and dry.     UC Treatments / Results  Labs (all labs ordered are listed, but only abnormal results are displayed) Labs Reviewed  POCT URINALYSIS DIP (DEVICE)  POCT PREGNANCY, URINE  CERVICOVAGINAL ANCILLARY ONLY    EKG None  Radiology No results found.  Procedures Procedures (including critical care time)  Medications Ordered in UC Medications - No data to display  Initial Impression / Assessment and Plan / UC Course  I have reviewed the triage vital signs and the nursing notes.  Pertinent labs &  imaging results that were available during my care of the patient were reviewed by me and considered in my medical decision making (see chart for details).     No red flag abdominal findings on exam. Negative urine pregnancy. UA without acute findings. Concern for recurrent BV vs addition of yeast as well. Vaginal swab pending. Flagyl initiated. Will notify of any positive findings and if any changes to treatment are needed.  Continue to follow with pcp and/or gynecologist as needed. Patient verbalized understanding and agreeable to plan.    Final Clinical Impressions(s) / UC Diagnoses   Final diagnoses:  Acute vaginitis     Discharge Instructions     We are starting treatment for BV today.  Complete course of antibiotics. Don't drink alcohol while taking.  A probiotic may also be helpful.  I would avoid having sex until symptoms have resolved.  Will notify you of any positive findings from your vaginal swab and if any changes to treatment are needed.   If symptoms worsen or do not improve in the next week to return to be seen or to follow up with PCP or gynecologist.      ED Prescriptions    Medication Sig Dispense Auth. Provider   metroNIDAZOLE (FLAGYL) 500 MG tablet Take 1 tablet (500 mg total) by mouth 2 (two) times daily for 7 days. 14 tablet Georgetta Haber, NP     Controlled Substance Prescriptions [place] Controlled Substance Registry consulted? Not Applicable   Georgetta Haber, NP 12/13/17 1113

## 2017-12-15 LAB — CERVICOVAGINAL ANCILLARY ONLY
Bacterial vaginitis: NEGATIVE
CHLAMYDIA, DNA PROBE: NEGATIVE
Candida vaginitis: POSITIVE — AB
NEISSERIA GONORRHEA: NEGATIVE
TRICH (WINDOWPATH): NEGATIVE

## 2017-12-17 ENCOUNTER — Encounter: Payer: Self-pay | Admitting: Nurse Practitioner

## 2017-12-17 ENCOUNTER — Ambulatory Visit (INDEPENDENT_AMBULATORY_CARE_PROVIDER_SITE_OTHER): Payer: 59 | Admitting: Nurse Practitioner

## 2017-12-17 VITALS — BP 100/69 | HR 69 | Temp 98.3°F | Ht 63.0 in | Wt 176.0 lb

## 2017-12-17 DIAGNOSIS — R1084 Generalized abdominal pain: Secondary | ICD-10-CM

## 2017-12-17 DIAGNOSIS — B373 Candidiasis of vulva and vagina: Secondary | ICD-10-CM

## 2017-12-17 DIAGNOSIS — K5900 Constipation, unspecified: Secondary | ICD-10-CM | POA: Diagnosis not present

## 2017-12-17 DIAGNOSIS — B3731 Acute candidiasis of vulva and vagina: Secondary | ICD-10-CM

## 2017-12-17 MED ORDER — FLUCONAZOLE 100 MG PO TABS: ORAL_TABLET | ORAL | 0 refills | Status: DC

## 2017-12-17 NOTE — Patient Instructions (Signed)
Return in 6 weeks for follow up

## 2017-12-17 NOTE — Progress Notes (Signed)
  Subjective:     [name] , female    DOB: 07-17-1990 , 27 y.o.   MRN: [id number]   Dysuria   Associated symptoms include nausea. Pertinent negatives include no vomiting. Currently being treated for bacterial vaginosis.    Abdominal Pain  This is a new problem. The current episode started 1 to 4 weeks ago. The onset quality is sudden. The problem occurs intermittently. The pain is located in the periumbilical region and suprapubic region. The pain is mild. The quality of the pain is sharp. Associated symptoms include diarrhea, dysuria and nausea. Pertinent negatives include no constipation, vomiting or weight loss. Associated symptoms comments: Intermittent constipation and diarrhea.. The pain is aggravated by eating and palpation. Treatments tried: Has taken tums for reflux in the past.  There is no history of GERD or irritable bowel syndrome. Currently being treated for bacterial vaginosis.       No past medical history on file.    Current Outpatient Medications:  .  metroNIDAZOLE (FLAGYL) 500 MG tablet, Take 1 tablet (500 mg total) by mouth 2 (two) times daily for 7 days., Disp: 14 tablet, Rfl: 0 .  Prenatal Vit-Fe Fumarate-FA (PRENATAL MULTIVITAMIN) TABS tablet, Take 1 tablet by mouth daily at 12 noon., Disp: , Rfl:    Review of Systems  Constitutional: Negative for weight loss.  Gastrointestinal: Positive for abdominal pain, diarrhea and nausea. Negative for constipation and vomiting.  Genitourinary: Positive for dysuria.     Today's Vitals   12/17/17 1018  BP: 100/69  Pulse: 69  Temp: 98.3 F (36.8 C)  TempSrc: Oral  SpO2: 97%  Weight: 176 lb (79.8 kg)  Height: 5\' 3"  (1.6 m)  PainSc: 0-No pain   Body mass index is 31.18 kg/m.   Objective:  Physical Exam  Constitutional: She appears well-developed and well-nourished.  Cardiovascular: Normal rate and regular rhythm.  Pulmonary/Chest: Effort normal and breath sounds normal.  Abdominal: Soft. Normal  appearance, normal aorta and bowel sounds are normal. There is no tenderness. There is no rebound, no guarding and no tenderness at McBurney's point.  Skin: Skin is warm and dry.        Assessment And Plan:   1. Generalized abdominal pain  No current pain on palpation  Seen by Dr. Normand Sloop yesterday negative for cyst/had transvaginal ultrasound  Currently being treated for bacterial vaginosis  She also had UA done in Urgent care was normal.    2. Constipation, unspecified constipation type  Intermittent episodes of constipation and loose bowels  Has changed her diet back to eating meat in the last few months may be causing irritation  Will try her on Linzess, samples given if effective will send a prescription.   Her nausea also may be associated with constipation, will see if this improves with the Linzess.  3. Vaginal candidiasis  After review of her labs from 12/13/17 - positive for candidiasis   Will send Rx for Diflucan    Arnette Felts, FNP

## 2018-02-02 ENCOUNTER — Ambulatory Visit: Payer: 59 | Admitting: Internal Medicine

## 2018-02-02 ENCOUNTER — Ambulatory Visit: Payer: 59 | Admitting: Nurse Practitioner

## 2018-02-18 ENCOUNTER — Other Ambulatory Visit (HOSPITAL_COMMUNITY): Payer: Self-pay | Admitting: Obstetrics and Gynecology

## 2018-02-18 ENCOUNTER — Ambulatory Visit (HOSPITAL_COMMUNITY)
Admission: RE | Admit: 2018-02-18 | Discharge: 2018-02-18 | Disposition: A | Discharge status: Home / Self Care | Payer: 59 | Source: Ambulatory Visit | Attending: Obstetrics and Gynecology | Admitting: Obstetrics and Gynecology

## 2018-02-18 DIAGNOSIS — O200 Threatened abortion: Secondary | ICD-10-CM

## 2018-02-18 DIAGNOSIS — Z3A01 Less than 8 weeks gestation of pregnancy: Secondary | ICD-10-CM | POA: Diagnosis not present

## 2018-02-23 ENCOUNTER — Other Ambulatory Visit: Payer: Self-pay | Admitting: Obstetrics and Gynecology

## 2018-02-25 ENCOUNTER — Ambulatory Visit (HOSPITAL_COMMUNITY)
Admission: RE | Admit: 2018-02-25 | Discharge: 2018-02-25 | Disposition: A | Discharge status: Home / Self Care | Payer: 59 | Attending: Obstetrics and Gynecology | Admitting: Obstetrics and Gynecology

## 2018-02-25 ENCOUNTER — Ambulatory Visit (HOSPITAL_COMMUNITY): Payer: 59 | Admitting: Anesthesiology

## 2018-02-25 ENCOUNTER — Encounter (HOSPITAL_COMMUNITY): Payer: Self-pay

## 2018-02-25 ENCOUNTER — Encounter (HOSPITAL_COMMUNITY)
Admission: RE | Disposition: A | Discharge status: Home / Self Care | Payer: Self-pay | Source: Home / Self Care | Attending: Obstetrics and Gynecology

## 2018-02-25 ENCOUNTER — Ambulatory Visit (HOSPITAL_COMMUNITY): Payer: 59

## 2018-02-25 ENCOUNTER — Other Ambulatory Visit: Payer: Self-pay

## 2018-02-25 DIAGNOSIS — Z79899 Other long term (current) drug therapy: Secondary | ICD-10-CM | POA: Diagnosis not present

## 2018-02-25 DIAGNOSIS — O021 Missed abortion: Secondary | ICD-10-CM | POA: Diagnosis not present

## 2018-02-25 DIAGNOSIS — R58 Hemorrhage, not elsewhere classified: Secondary | ICD-10-CM

## 2018-02-25 HISTORY — PX: DILATION AND EVACUATION: SHX1459

## 2018-02-25 LAB — TYPE AND SCREEN
ABO/RH(D): O POS
Antibody Screen: NEGATIVE

## 2018-02-25 LAB — CBC
HEMATOCRIT: 36.9 % (ref 36.0–46.0)
HEMOGLOBIN: 12.4 g/dL (ref 12.0–15.0)
MCH: 31.6 pg (ref 26.0–34.0)
MCHC: 33.6 g/dL (ref 30.0–36.0)
MCV: 94.1 fL (ref 80.0–100.0)
Platelets: 304 10*3/uL (ref 150–400)
RBC: 3.92 MIL/uL (ref 3.87–5.11)
RDW: 12.3 % (ref 11.5–15.5)
WBC: 6.9 10*3/uL (ref 4.0–10.5)
nRBC: 0 % (ref 0.0–0.2)

## 2018-02-25 LAB — ABO/RH: ABO/RH(D): O POS

## 2018-02-25 SURGERY — DILATION AND EVACUATION, UTERUS
Anesthesia: Monitor Anesthesia Care

## 2018-02-25 MED ORDER — OXYCODONE-ACETAMINOPHEN 2.5-325 MG PO TABS: 1.0000 | ORAL_TABLET | Freq: Four times a day (QID) | ORAL | 0 refills | Status: DC | PRN

## 2018-02-25 MED ORDER — LIDOCAINE HCL 2 % IJ SOLN
INTRAMUSCULAR | Status: AC
  Filled 2018-02-25: qty 20

## 2018-02-25 MED ORDER — PROPOFOL 10 MG/ML IV BOLUS
INTRAVENOUS | Status: AC
  Filled 2018-02-25: qty 40

## 2018-02-25 MED ORDER — KETOROLAC TROMETHAMINE 30 MG/ML IJ SOLN
INTRAMUSCULAR | Status: AC
  Filled 2018-02-25: qty 1

## 2018-02-25 MED ORDER — FENTANYL CITRATE (PF) 100 MCG/2ML IJ SOLN
INTRAMUSCULAR | Status: AC
  Filled 2018-02-25: qty 4

## 2018-02-25 MED ORDER — FENTANYL CITRATE (PF) 100 MCG/2ML IJ SOLN
INTRAMUSCULAR | Status: AC
  Administered 2018-02-25: 25 ug via INTRAVENOUS
  Filled 2018-02-25: qty 2

## 2018-02-25 MED ORDER — ONDANSETRON HCL 4 MG/2ML IJ SOLN
INTRAMUSCULAR | Status: AC
  Filled 2018-02-25: qty 2

## 2018-02-25 MED ORDER — SILVER NITRATE-POT NITRATE 75-25 % EX MISC
CUTANEOUS | Status: DC | PRN
  Administered 2018-02-25: 2 via TOPICAL

## 2018-02-25 MED ORDER — DEXAMETHASONE SODIUM PHOSPHATE 4 MG/ML IJ SOLN
INTRAMUSCULAR | Status: DC | PRN
  Administered 2018-02-25: 4 mg via INTRAVENOUS

## 2018-02-25 MED ORDER — DEXAMETHASONE SODIUM PHOSPHATE 4 MG/ML IJ SOLN
INTRAMUSCULAR | Status: AC
  Filled 2018-02-25: qty 1

## 2018-02-25 MED ORDER — LACTATED RINGERS IV SOLN
INTRAVENOUS | Status: DC
  Administered 2018-02-25 (×3): via INTRAVENOUS

## 2018-02-25 MED ORDER — SCOPOLAMINE 1 MG/3DAYS TD PT72
1.0000 | MEDICATED_PATCH | Freq: Once | TRANSDERMAL | Status: DC
  Administered 2018-02-25: 1.5 mg via TRANSDERMAL

## 2018-02-25 MED ORDER — IBUPROFEN 600 MG PO TABS: 600.0000 mg | ORAL_TABLET | Freq: Four times a day (QID) | ORAL | 0 refills | Status: DC | PRN

## 2018-02-25 MED ORDER — HYDROCODONE-ACETAMINOPHEN 7.5-325 MG PO TABS
1.0000 | ORAL_TABLET | Freq: Once | ORAL | Status: AC | PRN
  Administered 2018-02-25: 1 via ORAL

## 2018-02-25 MED ORDER — KETOROLAC TROMETHAMINE 30 MG/ML IJ SOLN
INTRAMUSCULAR | Status: DC | PRN
  Administered 2018-02-25: 30 mg via INTRAVENOUS

## 2018-02-25 MED ORDER — LIDOCAINE HCL (CARDIAC) PF 100 MG/5ML IV SOSY
PREFILLED_SYRINGE | INTRAVENOUS | Status: AC
  Filled 2018-02-25: qty 5

## 2018-02-25 MED ORDER — FENTANYL CITRATE (PF) 100 MCG/2ML IJ SOLN
INTRAMUSCULAR | Status: DC | PRN
  Administered 2018-02-25: 100 ug via INTRAVENOUS

## 2018-02-25 MED ORDER — METOCLOPRAMIDE HCL 5 MG/ML IJ SOLN: 10.0000 mg | Freq: Once | INTRAMUSCULAR | Status: DC | PRN

## 2018-02-25 MED ORDER — ONDANSETRON HCL 4 MG/2ML IJ SOLN
INTRAMUSCULAR | Status: DC | PRN
  Administered 2018-02-25: 4 mg via INTRAVENOUS

## 2018-02-25 MED ORDER — MEPERIDINE HCL 25 MG/ML IJ SOLN: 6.2500 mg | INTRAMUSCULAR | Status: DC | PRN

## 2018-02-25 MED ORDER — MIDAZOLAM HCL 2 MG/2ML IJ SOLN
INTRAMUSCULAR | Status: AC
  Filled 2018-02-25: qty 2

## 2018-02-25 MED ORDER — FENTANYL CITRATE (PF) 100 MCG/2ML IJ SOLN
25.0000 ug | INTRAMUSCULAR | Status: DC | PRN
  Administered 2018-02-25: 25 ug via INTRAVENOUS

## 2018-02-25 MED ORDER — MIDAZOLAM HCL 2 MG/2ML IJ SOLN
INTRAMUSCULAR | Status: DC | PRN
  Administered 2018-02-25: 2 mg via INTRAVENOUS

## 2018-02-25 MED ORDER — PROPOFOL 500 MG/50ML IV EMUL
INTRAVENOUS | Status: DC | PRN
  Administered 2018-02-25: 75 ug/kg/min via INTRAVENOUS

## 2018-02-25 MED ORDER — DOXYCYCLINE HYCLATE 50 MG PO CAPS: 100.0000 mg | ORAL_CAPSULE | Freq: Two times a day (BID) | ORAL | 0 refills | Status: DC

## 2018-02-25 MED ORDER — HYDROCODONE-ACETAMINOPHEN 7.5-325 MG PO TABS
ORAL_TABLET | ORAL | Status: AC
  Administered 2018-02-25: 1 via ORAL
  Filled 2018-02-25: qty 1

## 2018-02-25 MED ORDER — SCOPOLAMINE 1 MG/3DAYS TD PT72
MEDICATED_PATCH | TRANSDERMAL | Status: AC
  Administered 2018-02-25: 1.5 mg via TRANSDERMAL
  Filled 2018-02-25: qty 1

## 2018-02-25 MED ORDER — PROPOFOL 10 MG/ML IV BOLUS
INTRAVENOUS | Status: DC | PRN
  Administered 2018-02-25: 20 mg via INTRAVENOUS
  Administered 2018-02-25: 10 mg via INTRAVENOUS
  Administered 2018-02-25 (×2): 20 mg via INTRAVENOUS

## 2018-02-25 MED ORDER — LIDOCAINE HCL 2 % IJ SOLN
INTRAMUSCULAR | Status: DC | PRN
  Administered 2018-02-25: 20 mL

## 2018-02-25 MED ORDER — LIDOCAINE HCL (CARDIAC) PF 100 MG/5ML IV SOSY
PREFILLED_SYRINGE | INTRAVENOUS | Status: DC | PRN
  Administered 2018-02-25: 40 mg via INTRAVENOUS

## 2018-02-25 MED ORDER — GLYCOPYRROLATE 0.2 MG/ML IJ SOLN
INTRAMUSCULAR | Status: DC | PRN
  Administered 2018-02-25: 0.1 mg via INTRAVENOUS

## 2018-02-25 SURGICAL SUPPLY — 17 items
CATH ROBINSON RED A/P 16FR (CATHETERS) ×2 IMPLANT
DECANTER SPIKE VIAL GLASS SM (MISCELLANEOUS) ×2 IMPLANT
DILATOR CANAL MILEX (MISCELLANEOUS) IMPLANT
GLOVE BIO SURGEON STRL SZ 6.5 (GLOVE) ×2 IMPLANT
GLOVE BIOGEL PI IND STRL 7.0 (GLOVE) ×2 IMPLANT
GLOVE BIOGEL PI INDICATOR 7.0 (GLOVE) ×2
GOWN STRL REUS W/TWL LRG LVL3 (GOWN DISPOSABLE) ×4 IMPLANT
HIBICLENS CHG 4% 4OZ BTL (MISCELLANEOUS) ×2 IMPLANT
KIT BERKELEY 1ST TRIMESTER 3/8 (MISCELLANEOUS) ×2 IMPLANT
NS IRRIG 1000ML POUR BTL (IV SOLUTION) ×2 IMPLANT
PACK VAGINAL MINOR WOMEN LF (CUSTOM PROCEDURE TRAY) ×2 IMPLANT
PAD OB MATERNITY 4.3X12.25 (PERSONAL CARE ITEMS) ×2 IMPLANT
PAD PREP 24X48 CUFFED NSTRL (MISCELLANEOUS) ×2 IMPLANT
SET BERKELEY SUCTION TUBING (SUCTIONS) ×2 IMPLANT
SYR 20CC LL (SYRINGE) ×2 IMPLANT
TOWEL OR 17X24 6PK STRL BLUE (TOWEL DISPOSABLE) ×4 IMPLANT
VACURETTE 7MM CVD STRL WRAP (CANNULA) ×2 IMPLANT

## 2018-02-25 NOTE — Anesthesia Postprocedure Evaluation (Signed)
Anesthesia Post Note  Patient: Catering manager  Procedure(s) Performed: SUCTION DILATATION AND EVACUATION WITH ULTRASOUND GUIDANCE (N/A )     Patient location during evaluation: PACU Anesthesia Type: MAC Level of consciousness: awake and alert and oriented Pain management: pain level controlled Vital Signs Assessment: post-procedure vital signs reviewed and stable Respiratory status: spontaneous breathing, nonlabored ventilation and respiratory function stable Cardiovascular status: stable and blood pressure returned to baseline Postop Assessment: no apparent nausea or vomiting Anesthetic complications: no    Last Vitals:  Vitals:   02/25/18 1315 02/25/18 1400  BP: 114/69   Pulse: 68   Resp: 13   Temp:  37.1 C  SpO2: 100%     Last Pain:  Vitals:   02/25/18 1304  TempSrc:   PainSc: 6    Pain Goal: Patients Stated Pain Goal: 4 (02/25/18 1304)               Demarr Kluever A.

## 2018-02-25 NOTE — Transfer of Care (Signed)
Immediate Anesthesia Transfer of Care Note  Patient: Felicia Hatfield  Procedure(s) Performed: SUCTION DILATATION AND EVACUATION WITH ULTRASOUND GUIDANCE (N/A )  Patient Location: PACU  Anesthesia Type:MAC  Level of Consciousness: awake, alert  and oriented  Airway & Oxygen Therapy: Patient Spontanous Breathing and Patient connected to nasal cannula oxygen  Post-op Assessment: Report given to RN and Post -op Vital signs reviewed and stable  Post vital signs: Reviewed and stable  Last Vitals:  Vitals Value Taken Time  BP    Temp    Pulse 82 02/25/2018 12:50 PM  Resp    SpO2 100 % 02/25/2018 12:50 PM  Vitals shown include unvalidated device data.  Last Pain:  Vitals:   02/25/18 1005  TempSrc: Oral  PainSc: 0-No pain      Patients Stated Pain Goal: 4 (78/47/84 1282)  Complications: No apparent anesthesia complications

## 2018-02-25 NOTE — Discharge Instructions (Signed)
Post Anesthesia Home Care Instructions  Activity: Get plenty of rest for the remainder of the day. A responsible individual must stay with you for 24 hours following the procedure.  For the next 24 hours, DO NOT: -Drive a car -Advertising copywriterperate machinery -Drink alcoholic beverages -Take any medication unless instructed by your physician -Make any legal decisions or sign important papers.  Meals: Start with liquid foods such as gelatin or soup. Progress to regular foods as tolerated. Avoid greasy, spicy, heavy foods. If nausea and/or vomiting occur, drink only clear liquids until the nausea and/or vomiting subsides. Call your physician if vomiting continues.  Special Instructions/Symptoms: Your throat may feel dry or sore from the anesthesia or the breathing tube placed in your throat during surgery. If this causes discomfort, gargle with warm salt water. The discomfort should disappear within 24 hours.  DISCHARGE INSTRUCTIONS: D&C / D&E The following instructions have been prepared to help you care for yourself upon your return home.   Personal hygiene:  Use sanitary pads for vaginal drainage, not tampons.  Shower the day after your procedure.  NO tub baths, pools or Jacuzzis for 2-3 weeks.  Wipe front to back after using the bathroom.  Activity and limitations:  Do NOT drive or operate any equipment for 24 hours. The effects of anesthesia are still present and drowsiness may result.  Do NOT rest in bed all day.  Walking is encouraged.  Walk up and down stairs slowly.  You may resume your normal activity in one to two days or as indicated by your physician.  Sexual activity: NO intercourse for at least 2 weeks after the procedure, or as indicated by your physician.  Diet: Eat a light meal as desired this evening. You may resume your usual diet tomorrow.  Return to work: You may resume your work activities in one to two days or as indicated by your doctor.  What to expect after  your surgery: Expect to have vaginal bleeding/discharge for 2-3 days and spotting for up to 10 days. It is not unusual to have soreness for up to 1-2 weeks. You may have a slight burning sensation when you urinate for the first day. Mild cramps may continue for a couple of days. You may have a regular period in 2-6 weeks.  Call your doctor for any of the following:  Excessive vaginal bleeding, saturating and changing one pad every hour.  Inability to urinate 6 hours after discharge from hospital.  Pain not relieved by pain medication.  Fever of 100.4 F or greater.  Unusual vaginal discharge or odor.   Miscarriage A miscarriage is the loss of an unborn baby (fetus) before the 20th week of pregnancy. Most miscarriages happen during the first 3 months of pregnancy. Sometimes, a miscarriage can happen before a woman knows that she is pregnant. Having a miscarriage can be an emotional experience. If you have had a miscarriage, talk with your health care provider about any questions you may have about miscarrying, the grieving process, and your plans for future pregnancy. What are the causes? A miscarriage may be caused by:  Problems with the genes or chromosomes of the fetus. These problems make it impossible for the baby to develop normally. They are often the result of random errors that occur early in the development of the baby, and are not passed from parent to child (not inherited).  Infection of the cervix or uterus.  Conditions that affect hormone balance in the body.  Problems with the cervix,  such as the cervix opening and thinning before pregnancy is at term (cervical insufficiency).  Problems with the uterus. These may include: ? A uterus with an abnormal shape. ? Fibroids in the uterus. ? Congenital abnormalities. These are problems that were present at birth.  Certain medical conditions.  Smoking, drinking alcohol, or using drugs.  Injury (trauma). In many cases, the  cause of a miscarriage is not known. What are the signs or symptoms? Symptoms of this condition include:  Vaginal bleeding or spotting, with or without cramps or pain.  Pain or cramping in the abdomen or lower back.  Passing fluid, tissue, or blood clots from the vagina. How is this diagnosed? This condition may be diagnosed based on:  A physical exam.  Ultrasound.  Blood tests.  Urine tests. How is this treated? Treatment for a miscarriage is sometimes not necessary if you naturally pass all the tissue that was in your uterus. If necessary, this condition may be treated with:  Dilation and curettage (D&C). This is a procedure in which the cervix is stretched open and the lining of the uterus (endometrium) is scraped. This is done only if tissue from the fetus or placenta remains in the body (incomplete miscarriage).  Medicines, such as: ? Antibiotic medicine, to treat infection. ? Medicine to help the body pass any remaining tissue. ? Medicine to reduce (contract) the size of the uterus. These medicines may be given if you have a lot of bleeding. If you have Rh negative blood and your baby was Rh positive, you will need a shot of a medicine called Rh immunoglobulinto protect your future babies from Rh blood problems. "Rh-negative" and "Rh-positive" refer to whether or not the blood has a specific protein found on the surface of red blood cells (Rh factor). Follow these instructions at home: Medicines   Take over-the-counter and prescription medicines only as told by your health care provider.  If you were prescribed antibiotic medicine, take it as told by your health care provider. Do not stop taking the antibiotic even if you start to feel better.  Do not take NSAIDs, such as aspirin and ibuprofen, unless they are approved by your health care provider. These medicines can cause bleeding. Activity  Rest as directed. Ask your health care provider what activities are safe for  you.  Have someone help with home and family responsibilities during this time. General instructions  Keep track of the number of sanitary pads you use each day and how soaked (saturated) they are. Write down this information.  Monitor the amount of tissue or blood clots that you pass from your vagina. Save any large amounts of tissue for your health care provider to examine.  Do not use tampons, douche, or have sex until your health care provider approves.  To help you and your partner with the process of grieving, talk with your health care provider or seek counseling.  When you are ready, meet with your health care provider to discuss any important steps you should take for your health. Also, discuss steps you should take to have a healthy pregnancy in the future.  Keep all follow-up visits as told by your health care provider. This is important. Where to find more information  The American Congress of Obstetricians and Gynecologists: www.acog.org  [place] Department of Health and [employer]ist of Womens Health: http://hoffman.com/ Contact a health care provider if:  You have a fever or chills.  You have a foul smelling vaginal discharge.  You  have more bleeding instead of less. Get help right away if:  You have severe cramps or pain in your back or abdomen.  You pass blood clots or tissue from your vagina that is walnut-sized or larger.  You soak more than 1 regular sanitary pad in an hour.  You become light-headed or weak.  You pass out.  You have feelings of sadness that take over your thoughts, or you have thoughts of hurting yourself. Summary  Most miscarriages happen in the first 3 months of pregnancy. Sometimes miscarriage happens before a woman even knows that she is pregnant.  Follow your health care provider's instruction for home care. Keep all follow-up appointments.  To help you and your partner with the process of grieving, talk with your health  care provider or seek counseling. This information is not intended to replace advice given to you by your health care provider. Make sure you discuss any questions you have with your health care provider. Document Released: 08/21/2000 Document Revised: 04/02/2016 Document Reviewed: 04/02/2016 Elsevier Interactive Patient Education  2019 [employer].

## 2018-02-25 NOTE — Anesthesia Preprocedure Evaluation (Signed)
Anesthesia Evaluation  Patient identified by MRN, date of birth, ID band Patient awake    Reviewed: Allergy & Precautions, NPO status , Patient's Chart, lab work & pertinent test results  Airway Mallampati: II  TM Distance: >3 FB Neck ROM: Full    Dental no notable dental hx. (+) Teeth Intact   Pulmonary neg pulmonary ROS,    Pulmonary exam normal breath sounds clear to auscultation       Cardiovascular negative cardio ROS Normal cardiovascular exam Rhythm:Regular Rate:Normal     Neuro/Psych negative neurological ROS  negative psych ROS   GI/Hepatic negative GI ROS, Neg liver ROS,   Endo/Other  negative endocrine ROS  Renal/GU Hx/o Pyelonephritis  negative genitourinary   Musculoskeletal negative musculoskeletal ROS (+)   Abdominal   Peds  Hematology negative hematology ROS (+)   Anesthesia Other Findings   Reproductive/Obstetrics Missed Ab                              Anesthesia Physical Anesthesia Plan  ASA: II  Anesthesia Plan: MAC   Post-op Pain Management:    Induction: Intravenous  PONV Risk Score and Plan: 3 and Midazolam, Ondansetron, Dexamethasone and Treatment may vary due to age or medical condition  Airway Management Planned: Natural Airway, Nasal Cannula and Simple Face Mask  Additional Equipment:   Intra-op Plan:   Post-operative Plan:   Informed Consent: I have reviewed the patients History and Physical, chart, labs and discussed the procedure including the risks, benefits and alternatives for the proposed anesthesia with the patient or authorized representative who has indicated his/her understanding and acceptance.   Dental advisory given  Plan Discussed with: CRNA and Surgeon  Anesthesia Plan Comments:         Anesthesia Quick Evaluation

## 2018-02-25 NOTE — Op Note (Signed)
Pre op DX :  Missed  Abortion vs molar pregnancy Postoperative Diagnosis: same Procedure:  dilation and evacuation Anesthesia:  MAC and local Surgeon:  Dr. Charlesetta Garibaldi Asst : none Complications : none Procedure in detail: The patient was taken to the operating room where she was given MAC anesthesia. She was placed in dorsal lithotomy position and prepped and draped in normal sterile fashion. In and out catheter was used to drain the bladder. This was examined and noted to have a 7 week size uterus with no adnexal masses. A bivalve was placed into the vagina. A tenaculum was placed on the cervix. The cervix was infiltrated with 20 cc 1% lidocaine paracervical block. The cervix then dilated with dilators up to 23.  A size 7 suction curettage was placed into the uterine cavity. A scant amount of products of conception was seen. The suction curettage was removed when a gritty texture was noted. A sharp curette was done along all walls  of the uterus. The suction curet was placed back into the uterine cavity. No further products of conception were obtained. Ultrasound demonstrated a thin stripe with no flow.   Sponge lap and needle counts were correct. Patient back in stable condition. The patient understood to be the risks to be, but not  limited to,  Bleeding,  Infection,  damage to internal organs by perforation of the uterus and Asherman syndrome (scarring in the uterus) leading to infertility.

## 2018-02-25 NOTE — H&P (Signed)
Felicia Hinton27 y.o.presents with somespottiong while early pregnant.  By LMP she is 7 weeks.  She had an [place] which showed cystic structures in the uterus.  No IUP.  Beta quant went from 40,000 to 59,000 over 48 hours.  She denies any headache or SOB or chest pain Pertinent Gynecological History: Contraception: Education given regarding options for contraception, including none. Blood transfusions: na Sexually transmitted diseases: na Previous GYN Procedures: none Last mammogram:  Last pap: normal Date: normal 2018 OB History: G1P0   Menstrual History: Menarche age: 72 No LMP recorded.    History reviewed. No pertinent past medical history. History reviewed. No pertinent surgical history.  Current Facility-Administered Medications:  .  lactated ringers infusion, , Intravenous, Continuous, Ellender, Catheryn Bacon, MD, Last Rate: 125 mL/hr at 02/25/18 1029 .  scopolamine (TRANSDERM-SCOP) 1 MG/3DAYS 1.5 mg, 1 patch, Transdermal, Once, Ellender, Catheryn Bacon, MD, 1.5 mg at 02/25/18 1013  Facility-Administered Medications Ordered in Other Encounters:  .  fentaNYL (SUBLIMAZE) injection, , , Anesthesia Intra-op, Fussell, Wynn O, CRNA, 100 mcg at 02/25/18 1151 .  glycopyrrolate (ROBINUL) injection, , , Anesthesia Intra-op, Fussell, Wynn O, CRNA, 0.1 mg at 02/25/18 1151 .  midazolam (VERSED) injection, , , Anesthesia Intra-op, Fussell, Sharman Crate, CRNA, 2 mg at 02/25/18 1151 No Known Allergies Review of Systems - Negative except above   Physical Exam  BP 113/68   Pulse 94   Temp 97.9 F (36.6 C) (Oral)   Resp 16   Ht 5' 3.5" (1.613 m)   Wt 78.9 kg   SpO2 100%   BMI 30.34 kg/m  Constitutional: She appears well-developed and well-nourished.  HENT:  Head: Normocephalic.  Eyes: Pupils are equal, round, and reactive to light.  Neck: Normal range of motion. Neck supple.  Cardiovascular: Regular rhythm.   Respiratory: Effort normal and breath sounds normal.  GI: Soft.  Genitourinary:some mild spottng.  Uterus 7 week size NT no adnexal tenderness B scant spotting Musculoskeletal: Normal range of motion.  Neurological: She is alert.  Skin: Skin is warm.  Psychiatric: She has a normal mood and affect.  Results for orders placed or performed during the hospital encounter of 02/25/18 (from the past 72 hour(s))  CBC     Status: None   Collection Time: 02/25/18 10:00 AM  Result Value Ref Range   WBC 6.9 4.0 - 10.5 K/uL   RBC 3.92 3.87 - 5.11 MIL/uL   Hemoglobin 12.4 12.0 - 15.0 g/dL   HCT 16.1 09.6 - 04.5 %   MCV 94.1 80.0 - 100.0 fL   MCH 31.6 26.0 - 34.0 pg   MCHC 33.6 30.0 - 36.0 g/dL   RDW 40.9 81.1 - 91.4 %   Platelets 304 150 - 400 K/uL   nRBC 0.0 0.0 - 0.2 %    Comment: Performed at [facility] : [facility], [address] [place], [place] [address]  Type and screen [facility]     Status: None   Collection Time: 02/25/18 10:00 AM  Result Value Ref Range   ABO/RH(D) O POS    Antibody Screen NEG    Sample Expiration      02/28/2018 Performed at [facility], [address], [place], [place] [address]    Korea width 7 week size with several cystic structures  Ovariesnormal B  Assessment/Plan: Missed abortion vs molar pregnancy.  Will do D&C with ultrasound.   .  Risks are but not limited to bleeding, infection, scarring of the uterus and perforation.  Will proceed.  This is also the surgical update.    Landon Bassford A 01/28/2011, 11:41 AM

## 2018-02-26 ENCOUNTER — Encounter (HOSPITAL_COMMUNITY): Payer: Self-pay | Admitting: Obstetrics and Gynecology

## 2018-03-17 LAB — OB RESULTS CONSOLE GBS: GBS: NEGATIVE

## 2018-04-06 ENCOUNTER — Ambulatory Visit: Payer: 59 | Admitting: Nurse Practitioner

## 2018-04-06 ENCOUNTER — Encounter (HOSPITAL_COMMUNITY): Payer: Self-pay | Admitting: Emergency Medicine

## 2018-04-06 ENCOUNTER — Other Ambulatory Visit: Payer: Self-pay

## 2018-04-06 ENCOUNTER — Ambulatory Visit (HOSPITAL_COMMUNITY)
Admission: EM | Admit: 2018-04-06 | Discharge: 2018-04-06 | Disposition: A | Discharge status: Home / Self Care | Payer: 59 | Attending: Family Medicine | Admitting: Family Medicine

## 2018-04-06 DIAGNOSIS — K5904 Chronic idiopathic constipation: Secondary | ICD-10-CM | POA: Diagnosis present

## 2018-04-06 DIAGNOSIS — J029 Acute pharyngitis, unspecified: Secondary | ICD-10-CM | POA: Diagnosis present

## 2018-04-06 DIAGNOSIS — K6289 Other specified diseases of anus and rectum: Secondary | ICD-10-CM | POA: Insufficient documentation

## 2018-04-06 HISTORY — DX: Irritable bowel syndrome, unspecified: K58.9

## 2018-04-06 LAB — POCT RAPID STREP A: Streptococcus, Group A Screen (Direct): NEGATIVE

## 2018-04-06 MED ORDER — HYDROCORTISONE ACETATE 25 MG RE SUPP: 25.0000 mg | Freq: Two times a day (BID) | RECTAL | 0 refills | Status: DC

## 2018-04-06 NOTE — ED Triage Notes (Signed)
Woke up hoarse today, sore throat and headache  Patient reports pressure with having bowel movements.  Feels a steady pressure.  This sensation started last week.  Prior to this, patient had been constipated

## 2018-04-06 NOTE — Discharge Instructions (Addendum)
For the constipation you can either try MiraLAX 1 capful twice a day until you get a good bowel movement Another option is magnesium citrate 1 dose Make sure you drink plenty fluids I am sending in a rectal suppository to use if still having pain after you have a good bowel movement  Your rapid strep test was negative This is most likely some sort of viral illness. It also could be due to allergies and the drainage in your throat. You  could try Zyrtec daily to see if this helps Follow up as needed for continued or worsening symptoms

## 2018-04-09 LAB — CULTURE, GROUP A STREP (THRC)

## 2018-04-09 NOTE — ED Provider Notes (Signed)
[facility]    CSN: [id number] Arrival date & time: 04/06/18  1700     History   Chief Complaint Chief Complaint  Patient presents with  . Hoarse    HPI Shanon AceVashti Benjiman CoreHinton is a 28 y.o. female.   Patient is a 28 year old female with past medical history of IBS.  She presents with pressure with having bowel movements and constipation.  She battles with chronic constipation.  This episode has lasted approximately 1 week.  She denies any rectal pain or bleeding.  No nausea, vomiting.  She is also here with sore throat, headache and laryngitis.  This has been present for 1 day. Denies any cough, congestion. Denies any fever, chills, myalgias. She has not taken anything to treat the sympotoms.   ROS per HPI      Past Medical History:  Diagnosis Date  . IBS (irritable bowel syndrome)     Patient Active Problem List   Diagnosis Date Noted  . Fever   . Leukocytosis 05/09/2014  . Nausea and vomiting 05/09/2014  . Pyelonephritis 05/08/2014  . Sepsis (HCC) 05/08/2014    Past Surgical History:  Procedure Laterality Date  . DILATION AND EVACUATION N/A 02/25/2018   Procedure: SUCTION DILATATION AND EVACUATION WITH ULTRASOUND GUIDANCE;  Surgeon: Jaymes Graffillard, Naima, MD;  Location: [facility];  Service: Gynecology;  Laterality: N/A;    OB History   No obstetric history on file.      Home Medications    Prior to Admission medications   Medication Sig Start Date End Date Taking? Authorizing Provider  hydrocortisone (ANUSOL-HC) 25 MG suppository Place 1 suppository (25 mg total) rectally 2 (two) times daily. 04/06/18   Dahlia ByesBast, Arleigh Odowd A, NP  ibuprofen (ADVIL,MOTRIN) 600 MG tablet Take 1 tablet (600 mg total) by mouth every 6 (six) hours as needed. 02/25/18   Jaymes Graffillard, Naima, MD  oxycodone-acetaminophen (PERCOCET) 2.5-325 MG tablet Take 1 tablet by mouth every 6 (six) hours as needed for pain. 02/25/18   Jaymes Graffillard, Naima, MD  oxycodone-acetaminophen (PERCOCET) 2.5-325 MG tablet Take 1  tablet by mouth every 6 (six) hours as needed for pain. 02/25/18   Jaymes Graffillard, Naima, MD    Family History Family History  Problem Relation Age of Onset  . Healthy Mother     Social History Social History   Tobacco Use  . Smoking status: Never Smoker  . Smokeless tobacco: Never Used  Substance Use Topics  . Alcohol use: Yes  . Drug use: No     Allergies   Patient has no known allergies.   Review of Systems Review of Systems   Physical Exam Triage Vital Signs ED Triage Vitals  Enc Vitals Group     BP 04/06/18 1733 109/61     Pulse Rate 04/06/18 1733 67     Resp 04/06/18 1733 18     Temp 04/06/18 1733 98.8 F (37.1 C)     Temp Source 04/06/18 1733 Temporal     SpO2 04/06/18 1733 100 %     Weight --      Height --      Head Circumference --      Peak Flow --      Pain Score 04/06/18 1729 8     Pain Loc --      Pain Edu? --      Excl. in GC? --    No data found.  Updated Vital Signs BP 109/61 (BP Location: Right Arm)   Pulse 67   Temp 98.8 F (  37.1 C) (Temporal)   Resp 18   LMP 12/09/2017   SpO2 100%   Visual Acuity Right Eye Distance:   Left Eye Distance:   Bilateral Distance:    Right Eye Near:   Left Eye Near:    Bilateral Near:     Physical Exam Vitals signs and nursing note reviewed.  Constitutional:      General: She is not in acute distress.    Appearance: Normal appearance. She is well-developed. She is not ill-appearing.  HENT:     Head: Normocephalic and atraumatic.     Right Ear: Tympanic membrane and ear canal normal.     Left Ear: Tympanic membrane and ear canal normal.     Nose: Congestion present.     Mouth/Throat:     Pharynx: Posterior oropharyngeal erythema present.  Eyes:     Conjunctiva/sclera: Conjunctivae normal.  Neck:     Musculoskeletal: Neck supple.  Cardiovascular:     Rate and Rhythm: Normal rate and regular rhythm.     Heart sounds: No murmur.  Pulmonary:     Effort: Pulmonary effort is normal. No  respiratory distress.     Breath sounds: Normal breath sounds.  Abdominal:     Palpations: Abdomen is soft.     Tenderness: There is no abdominal tenderness.  Genitourinary:    Comments: Pt deferred rectal exam   Musculoskeletal: Normal range of motion.  Lymphadenopathy:     Cervical: No cervical adenopathy.  Skin:    General: Skin is warm and dry.  Neurological:     Mental Status: She is alert.  Psychiatric:        Mood and Affect: Mood normal.      UC Treatments / Results  Labs (all labs ordered are listed, but only abnormal results are displayed) Labs Reviewed  CULTURE, GROUP A STREP [facility]  POCT RAPID STREP A    EKG None  Radiology No results found.  Procedures Procedures (including critical care time)  Medications Ordered in UC Medications - No data to display  Initial Impression / Assessment and Plan / UC Course  I have reviewed the triage vital signs and the nursing notes.  Pertinent labs & imaging results that were available during my care of the patient were reviewed by me and considered in my medical decision making (see chart for details).     Strep test negative Most likely viral illness She can use OTC symptomatic treatment  Recommended Zyrtec for symptoms  Pt has IBS and has been constipated having rectal pressure, hard stools Denies rectal bleeding  Reports that her and her spouse looked and did not see any external hemorrhoids.  Feels the pain is more internal She does not want a rectal exam.  Will have her take mirilax for constipation I will prescribe rectal suppository to use as needed Follow up as needed for continued or worsening symptoms  Final Clinical Impressions(s) / UC Diagnoses   Final diagnoses:  Chronic idiopathic constipation  Rectal pain  Sore throat     Discharge Instructions     For the constipation you can either try MiraLAX 1 capful twice a day until you get a good bowel movement Another option is magnesium  citrate 1 dose Make sure you drink plenty fluids I am sending in a rectal suppository to use if still having pain after you have a good bowel movement  Your rapid strep test was negative This is most likely some sort of viral illness. It also could  be due to allergies and the drainage in your throat. You  could try Zyrtec daily to see if this helps Follow up as needed for continued or worsening symptoms       ED Prescriptions    Medication Sig Dispense Auth. Provider   hydrocortisone (ANUSOL-HC) 25 MG suppository Place 1 suppository (25 mg total) rectally 2 (two) times daily. 12 suppository Shawntell Dixson A, NP     Controlled Substance Prescriptions [place] Controlled Substance Registry consulted? Not Applicable   Janace Aris, NP 04/09/18 1028

## 2018-05-17 ENCOUNTER — Encounter (HOSPITAL_COMMUNITY): Payer: Self-pay

## 2018-05-17 ENCOUNTER — Other Ambulatory Visit: Payer: Self-pay

## 2018-05-17 ENCOUNTER — Ambulatory Visit (HOSPITAL_COMMUNITY)
Admission: EM | Admit: 2018-05-17 | Discharge: 2018-05-17 | Disposition: A | Discharge status: Home / Self Care | Payer: 59 | Attending: Family Medicine | Admitting: Family Medicine

## 2018-05-17 DIAGNOSIS — J029 Acute pharyngitis, unspecified: Secondary | ICD-10-CM

## 2018-05-17 LAB — POCT RAPID STREP A: Streptococcus, Group A Screen (Direct): NEGATIVE

## 2018-05-17 NOTE — Discharge Instructions (Addendum)
Negative for strep throat.  Throat lozenges, gargles, chloraseptic spray, warm teas, popsicles etc to help with throat pain.   Tylenol and/or ibuprofen as needed for pain or fevers.   Push fluids to ensure adequate hydration and keep secretions thin.  If symptoms worsen or do not improve in the next week to return to be seen or to follow up with your PCP.

## 2018-05-17 NOTE — ED Provider Notes (Signed)
[facility]    CSN: [id number] Arrival date & time: 05/17/18  1038     History   Chief Complaint Chief Complaint  Patient presents with  . Sore Throat  . Otalgia    bilateral    HPI Felicia Hatfield is [place] 28 y.o. female.   Felicia Hatfield presents with complaints of sore throat and ear pressure which started yesterday. No known fevers. No cough. She feels some muscular aches. No gi/gu complaints. No rash. Has had some ill contacts at church. Hasn't taken any medications for symptoms. States she is concerned about strep throat which she has had in the past. Hx of pyelonephritis and sepsis.     ROS per HPI, negative if not otherwise mentioned.      Past Medical History:  Diagnosis Date  . IBS (irritable bowel syndrome)     Patient Active Problem List   Diagnosis Date Noted  . Fever   . Leukocytosis 05/09/2014  . Nausea and vomiting 05/09/2014  . Pyelonephritis 05/08/2014  . Sepsis (HCC) 05/08/2014    Past Surgical History:  Procedure Laterality Date  . DILATION AND EVACUATION N/[place] 02/25/2018   Procedure: SUCTION DILATATION AND EVACUATION WITH ULTRASOUND GUIDANCE;  Surgeon: Felicia Graff, MD;  Location: [facility];  Service: Gynecology;  Laterality: N/[place];    OB History   No obstetric history on file.      Home Medications    Prior to Admission medications   Medication Sig Start Date End Date Taking? Authorizing Provider  ibuprofen (ADVIL,MOTRIN) 600 MG tablet Take 1 tablet (600 mg total) by mouth every 6 (six) hours as needed. 02/25/18  Yes Dillard, Naima, MD  hydrocortisone (ANUSOL-HC) 25 MG suppository Place 1 suppository (25 mg total) rectally 2 (two) times daily. 04/06/18   Felicia Byes A, NP  oxycodone-acetaminophen (PERCOCET) 2.5-325 MG tablet Take 1 tablet by mouth every 6 (six) hours as needed for pain. 02/25/18   Felicia Graff, MD  oxycodone-acetaminophen (PERCOCET) 2.5-325 MG tablet Take 1 tablet by mouth every 6 (six) hours as needed for pain. 02/25/18    Felicia Graff, MD    Family History Family History  Problem Relation Age of Onset  . Healthy Mother     Social History Social History   Tobacco Use  . Smoking status: Never Smoker  . Smokeless tobacco: Never Used  Substance Use Topics  . Alcohol use: Yes  . Drug use: No     Allergies   Patient has no known allergies.   Review of Systems Review of Systems   Physical Exam Triage Vital Signs ED Triage Vitals  Enc Vitals Group     BP 05/17/18 1107 113/69     Pulse Rate 05/17/18 1107 77     Resp 05/17/18 1107 16     Temp 05/17/18 1107 99.4 F (37.4 C)     Temp Source 05/17/18 1107 Oral     SpO2 05/17/18 1107 99 %     Weight --      Height --      Head Circumference --      Peak Flow --      Pain Score 05/17/18 1109 7     Pain Loc --      Pain Edu? --      Excl. in GC? --    No data found.  Updated Vital Signs BP 113/69 (BP Location: Left Arm)   Pulse 77   Temp 99.4 F (37.4 C) (Oral)   Resp 16   LMP  04/09/2018 (Exact Date)   SpO2 99%    Physical Exam Constitutional:      General: She is not in acute distress.    Appearance: She is well-developed.  HENT:     Head: Normocephalic and atraumatic.     Right Ear: Tympanic membrane, ear canal and external ear normal.     Left Ear: Tympanic membrane, ear canal and external ear normal.     Nose: Nose normal.     Mouth/Throat:     Pharynx: Uvula midline.     Tonsils: No tonsillar exudate. Swelling: 1+ on the right. 1+ on the left.  Eyes:     Conjunctiva/sclera: Conjunctivae normal.     Pupils: Pupils are equal, round, and reactive to light.  Cardiovascular:     Rate and Rhythm: Normal rate and regular rhythm.     Heart sounds: Normal heart sounds.  Pulmonary:     Effort: Pulmonary effort is normal.     Breath sounds: Normal breath sounds.  Lymphadenopathy:     Cervical: No cervical adenopathy.  Skin:    General: Skin is warm and dry.  Neurological:     Mental Status: She is alert and oriented  to person, place, and time.      UC Treatments / Results  Labs (all labs ordered are listed, but only abnormal results are displayed) Labs Reviewed  CULTURE, GROUP [place] STREP [facility])  POCT RAPID STREP [place]    EKG None  Radiology No results found.  Procedures Procedures (including critical care time)  Medications Ordered in UC Medications - No data to display  Initial Impression / Assessment and Plan / UC Course  I have reviewed the triage vital signs and the nursing notes.  Pertinent labs & imaging results that were available during my care of the patient were reviewed by me and considered in my medical decision making (see chart for details).     Non toxic, afebrile. Benign physical exam. Negative rapid strep. History and physical consistent with viral illness.  Supportive cares recommended. If symptoms worsen or do not improve in the next week to return to be seen or to follow up with PCP.      Final Clinical Impressions(s) / UC Diagnoses   Final diagnoses:  Acute pharyngitis, unspecified etiology     Discharge Instructions     Negative for strep throat.  Throat lozenges, gargles, chloraseptic spray, warm teas, popsicles etc to help with throat pain.   Tylenol and/or ibuprofen as needed for pain or fevers.   Push fluids to ensure adequate hydration and keep secretions thin.  If symptoms worsen or do not improve in the next week to return to be seen or to follow up with your PCP.      ED Prescriptions    None     Controlled Substance Prescriptions [place] Controlled Substance Registry consulted? Not Applicable   Felicia Haber, NP 05/17/18 1141

## 2018-05-17 NOTE — ED Triage Notes (Signed)
Pt presents to [facility] for sore throat and bilateral ear pain since last night. Pt complains of pain while swallowing, and pain neck and ears

## 2018-05-19 LAB — CULTURE, GROUP A STREP (THRC)

## 2018-05-20 ENCOUNTER — Telehealth (HOSPITAL_COMMUNITY): Payer: Self-pay | Admitting: Emergency Medicine

## 2018-05-20 NOTE — Telephone Encounter (Signed)
Attempted to reach patient. No answer at this time.   

## 2018-10-26 LAB — OB RESULTS CONSOLE RPR: RPR: NONREACTIVE

## 2018-10-26 LAB — OB RESULTS CONSOLE GC/CHLAMYDIA
Chlamydia: NEGATIVE
Gonorrhea: NEGATIVE

## 2018-10-26 LAB — OB RESULTS CONSOLE RUBELLA ANTIBODY, IGM: Rubella: IMMUNE

## 2018-10-26 LAB — OB RESULTS CONSOLE HEPATITIS B SURFACE ANTIGEN: Hepatitis B Surface Ag: NEGATIVE

## 2018-10-26 LAB — OB RESULTS CONSOLE ABO/RH: RH Type: POSITIVE

## 2018-10-26 LAB — OB RESULTS CONSOLE ANTIBODY SCREEN: Antibody Screen: NEGATIVE

## 2018-10-26 LAB — OB RESULTS CONSOLE HIV ANTIBODY (ROUTINE TESTING): HIV: NONREACTIVE

## 2019-01-18 LAB — OB RESULTS CONSOLE HIV ANTIBODY (ROUTINE TESTING): HIV: NONREACTIVE

## 2019-02-03 ENCOUNTER — Telehealth: Payer: Self-pay | Admitting: *Deleted

## 2019-02-03 NOTE — Telephone Encounter (Signed)
A message was left, re: new patient appointment. 

## 2019-02-23 ENCOUNTER — Other Ambulatory Visit: Payer: Self-pay

## 2019-02-23 ENCOUNTER — Ambulatory Visit: Payer: BC Managed Care – PPO | Admitting: Cardiovascular Disease

## 2019-02-23 ENCOUNTER — Encounter: Payer: Self-pay | Admitting: Cardiovascular Disease

## 2019-02-23 DIAGNOSIS — O2653 Maternal hypotension syndrome, third trimester: Secondary | ICD-10-CM

## 2019-02-23 DIAGNOSIS — I959 Hypotension, unspecified: Secondary | ICD-10-CM | POA: Insufficient documentation

## 2019-02-23 NOTE — Progress Notes (Signed)
02/23/2019 Felicia Hatfield   Jun 15, 1990  [id number]  Primary Physician Glendale Chard, MD Primary Cardiologist: Lorretta Harp MD Lupe Carney, [place]  HPI:  Felicia Hatfield is a 28 y.o. 33-week pregnant married African-American female with no children who works as a Geologist, engineering was referred by her OB/GYN, Dr. Charlesetta Garibaldi, for evaluation of symptomatic hypotension.  She has no cardiac risk factors.  This is her first pregnancy.  She has noticed episodes of relative hypotension which she is symptomatic from with associated weakness that occurs every several several days.  She denies chest pain or shortness of breath.   No outpatient medications have been marked as taking for the 02/23/19 encounter (Office Visit) with Lorretta Harp, MD.     No Known Allergies  Social History   Socioeconomic History  . Marital status: Married    Spouse name: Not on file  . Number of children: Not on file  . Years of education: Not on file  . Highest education level: Not on file  Occupational History  . Not on file  Tobacco Use  . Smoking status: Never Smoker  . Smokeless tobacco: Never Used  Substance and Sexual Activity  . Alcohol use: Yes  . Drug use: No  . Sexual activity: Yes    Birth control/protection: None  Other Topics Concern  . Not on file  Social History Narrative  . Not on file   Social Determinants of Health   Financial Resource Strain:   . Difficulty of Paying Living Expenses: Not on file  Food Insecurity:   . Worried About Charity fundraiser in the Last Year: Not on file  . Ran Out of Food in the Last Year: Not on file  Transportation Needs:   . Lack of Transportation (Medical): Not on file  . Lack of Transportation (Non-Medical): Not on file  Physical Activity:   . Days of Exercise per Week: Not on file  . Minutes of Exercise per Session: Not on file  Stress:   . Feeling of Stress : Not on file  Social Connections:   . Frequency of Communication  with Friends and Family: Not on file  . Frequency of Social Gatherings with Friends and Family: Not on file  . Attends Religious Services: Not on file  . Active Member of Clubs or Organizations: Not on file  . Attends Archivist Meetings: Not on file  . Marital Status: Not on file  Intimate Partner Violence:   . Fear of Current or Ex-Partner: Not on file  . Emotionally Abused: Not on file  . Physically Abused: Not on file  . Sexually Abused: Not on file     Review of Systems: General: negative for chills, fever, night sweats or weight changes.  Cardiovascular: negative for chest pain, dyspnea on exertion, edema, orthopnea, palpitations, paroxysmal nocturnal dyspnea or shortness of breath Dermatological: negative for rash Respiratory: negative for cough or wheezing Urologic: negative for hematuria Abdominal: negative for nausea, vomiting, diarrhea, bright red blood per rectum, melena, or hematemesis Neurologic: negative for visual changes, syncope, or dizziness All other systems reviewed and are otherwise negative except as noted above.    Blood pressure (!) 100/58, pulse 93, temperature (!) 97.2 F (36.2 C), height 5\' 4"  (1.626 m), weight 206 lb (93.4 kg).  General appearance: alert and no distress Neck: no adenopathy, no carotid bruit, no JVD, supple, symmetrical, trachea midline and thyroid not enlarged, symmetric, no tenderness/mass/nodules Lungs: clear to auscultation bilaterally Heart:  Soft flow murmur Extremities: extremities normal, atraumatic, no cyanosis or edema Pulses: 2+ and symmetric Skin: Skin color, texture, turgor normal. No rashes or lesions Neurologic: Alert and oriented X 3, normal strength and tone. Normal symmetric reflexes. Normal coordination and gait  EKG sinus rhythm at 93 with nonspecific ST and T wave changes.  Personally reviewed this EKG.  ASSESSMENT AND PLAN:   Low blood pressure Ms. Cabler was referred to me by Dr. Normand Sloop for  symptomatic hypotension.  She is [redacted] weeks pregnant.  This is her first pregnancy.  She has episodic hypotension with feelings of weakness.  She is on no medications.  I suspect this will resolve when she gives birth.  No further evaluation is recommended at this time.      Runell Gess MD FACP,FACC,FAHA, [facility] 02/23/2019 4:27 PM

## 2019-02-23 NOTE — Patient Instructions (Signed)
Medication Instructions:  Your physician recommends that you continue on your current medications as directed. Please refer to the Current Medication list given to you today.  If you need a refill on your cardiac medications before your next appointment, please call your pharmacy.   Lab work: NONE  Testing/Procedures: NONE  Follow-Up: At [facility], you and your health needs are our priority.  As part of our continuing mission to provide you with exceptional heart care, we have created designated Provider Care Teams.  These Care Teams include your primary Cardiologist (physician) and Advanced Practice Providers (APPs -  Physician Assistants and Nurse Practitioners) who all work together to provide you with the care you need, when you need it. You may see Dr Berry or one of the following Advanced Practice Providers on your designated Care Team:    Luke Kilroy, PA-C  Callie Goodrich, PA-C  Jesse Cleaver, FNP  Your physician wants you to follow-up in: 3 months       

## 2019-02-23 NOTE — Assessment & Plan Note (Signed)
Felicia Hatfield was referred to me by Dr. Charlesetta Garibaldi for symptomatic hypotension.  She is [redacted] weeks pregnant.  This is her first pregnancy.  She has episodic hypotension with feelings of weakness.  She is on no medications.  I suspect this will resolve when she gives birth.  No further evaluation is recommended at this time.

## 2019-03-12 NOTE — L&D Delivery Note (Addendum)
Operative Delivery Note At 7:19 AM a  viable female was delivered via Vaginal, Vacuum Investment banker, operational).  Presentation: vertex; Position: Right,, Occiput,, Anterior; Station: +2.   Verbal consent: obtained from patient.  Risks and benefits discussed in detail.  Risks include, but are not limited to the risks of anesthesia, bleeding, infection, damage to maternal tissues, fetal cephalhematoma.  There is also the risk of inability to effect vaginal delivery of the head, or shoulder dystocia that cannot be resolved by established maneuvers, leading to the need for emergency cesarean section.  Three pulls on the vacuum to the green zone, no pop-offs, baby delivered within 3 minutes from start of vacuum, vacuum pressure released between contractions. Vacuum applied for abnormal fetal heart tracing with pushing, fetal tachycardia with recurrent variable decelerations.    APGAR: 6, 10; weight  pending Placenta status:Normal appearing, to pathology.   Cord:  with the following complications: .  Cord pH: pending TIght nuchal cord not reduced due to fast progression of delivery.   Anesthesia: Epidural and local with lidocaine.   Instruments: Kiwi vacuum Episiotomy: None Lacerations: 1st degree;Labial (right) Suture Repair: 2.0 vicryl Est. Blood Loss (mL): 136  Mom to postpartum.  Baby to Couplet care / Skin to Skin.  Konrad Felix, MD.  04/17/2019, 8:00 AM

## 2019-03-18 LAB — OB RESULTS CONSOLE GBS: GBS: NEGATIVE

## 2019-04-08 ENCOUNTER — Other Ambulatory Visit: Payer: Self-pay | Admitting: Obstetrics & Gynecology

## 2019-04-12 ENCOUNTER — Telehealth (HOSPITAL_COMMUNITY): Payer: Self-pay | Admitting: *Deleted

## 2019-04-12 ENCOUNTER — Encounter (HOSPITAL_COMMUNITY): Payer: Self-pay | Admitting: *Deleted

## 2019-04-12 NOTE — Telephone Encounter (Signed)
Preadmission screen  

## 2019-04-15 ENCOUNTER — Other Ambulatory Visit (HOSPITAL_COMMUNITY)
Admission: RE | Admit: 2019-04-15 | Discharge: 2019-04-15 | Disposition: A | Discharge status: Home / Self Care | Payer: BC Managed Care – PPO | Source: Ambulatory Visit | Attending: Obstetrics & Gynecology | Admitting: Obstetrics & Gynecology

## 2019-04-15 LAB — SARS CORONAVIRUS 2 (TAT 6-24 HRS): SARS Coronavirus 2: NEGATIVE

## 2019-04-16 ENCOUNTER — Encounter (HOSPITAL_COMMUNITY): Payer: Self-pay | Admitting: Obstetrics and Gynecology

## 2019-04-16 ENCOUNTER — Inpatient Hospital Stay (HOSPITAL_COMMUNITY): Payer: BC Managed Care – PPO | Admitting: Anesthesiology

## 2019-04-16 ENCOUNTER — Other Ambulatory Visit: Payer: Self-pay

## 2019-04-16 ENCOUNTER — Inpatient Hospital Stay (HOSPITAL_COMMUNITY)
Admission: AD | Admit: 2019-04-16 | Discharge: 2019-04-19 | DRG: 806 | Disposition: A | Discharge status: Home / Self Care | Payer: BC Managed Care – PPO | Attending: Obstetrics & Gynecology | Admitting: Obstetrics & Gynecology

## 2019-04-16 DIAGNOSIS — K589 Irritable bowel syndrome without diarrhea: Secondary | ICD-10-CM | POA: Diagnosis present

## 2019-04-16 DIAGNOSIS — Z20822 Contact with and (suspected) exposure to covid-19: Secondary | ICD-10-CM | POA: Diagnosis present

## 2019-04-16 DIAGNOSIS — O9962 Diseases of the digestive system complicating childbirth: Secondary | ICD-10-CM | POA: Diagnosis present

## 2019-04-16 DIAGNOSIS — O99214 Obesity complicating childbirth: Secondary | ICD-10-CM | POA: Diagnosis present

## 2019-04-16 DIAGNOSIS — Z3A40 40 weeks gestation of pregnancy: Secondary | ICD-10-CM

## 2019-04-16 DIAGNOSIS — O26893 Other specified pregnancy related conditions, third trimester: Secondary | ICD-10-CM | POA: Diagnosis present

## 2019-04-16 LAB — CBC
HCT: 39.6 % (ref 36.0–46.0)
Hemoglobin: 13 g/dL (ref 12.0–15.0)
MCH: 31.3 pg (ref 26.0–34.0)
MCHC: 32.8 g/dL (ref 30.0–36.0)
MCV: 95.2 fL (ref 80.0–100.0)
Platelets: 217 10*3/uL (ref 150–400)
RBC: 4.16 MIL/uL (ref 3.87–5.11)
RDW: 13.8 % (ref 11.5–15.5)
WBC: 9.5 10*3/uL (ref 4.0–10.5)
nRBC: 0 % (ref 0.0–0.2)

## 2019-04-16 LAB — TYPE AND SCREEN
ABO/RH(D): O POS
Antibody Screen: NEGATIVE

## 2019-04-16 LAB — ABO/RH: ABO/RH(D): O POS

## 2019-04-16 MED ORDER — EPHEDRINE 5 MG/ML INJ: 10.0000 mg | INTRAVENOUS | Status: DC | PRN

## 2019-04-16 MED ORDER — OXYTOCIN 40 UNITS IN NORMAL SALINE INFUSION - SIMPLE MED
1.0000 m[IU]/min | INTRAVENOUS | Status: DC
  Administered 2019-04-16: 2 m[IU]/min via INTRAVENOUS
  Filled 2019-04-16: qty 1000

## 2019-04-16 MED ORDER — LIDOCAINE HCL (PF) 1 % IJ SOLN
INTRAMUSCULAR | Status: DC | PRN
  Administered 2019-04-16: 6 mL via EPIDURAL

## 2019-04-16 MED ORDER — DIPHENHYDRAMINE HCL 50 MG/ML IJ SOLN: 12.5000 mg | INTRAMUSCULAR | Status: DC | PRN

## 2019-04-16 MED ORDER — PHENYLEPHRINE 40 MCG/ML (10ML) SYRINGE FOR IV PUSH (FOR BLOOD PRESSURE SUPPORT)
80.0000 ug | PREFILLED_SYRINGE | INTRAVENOUS | Status: DC | PRN
  Administered 2019-04-16 (×2): 80 ug via INTRAVENOUS

## 2019-04-16 MED ORDER — PROMETHAZINE HCL 25 MG/ML IJ SOLN: 12.5000 mg | Freq: Four times a day (QID) | INTRAMUSCULAR | Status: DC | PRN

## 2019-04-16 MED ORDER — LIDOCAINE HCL (PF) 1 % IJ SOLN
30.0000 mL | INTRAMUSCULAR | Status: AC | PRN
  Administered 2019-04-17: 30 mL via SUBCUTANEOUS
  Filled 2019-04-16: qty 30

## 2019-04-16 MED ORDER — OXYTOCIN 40 UNITS IN NORMAL SALINE INFUSION - SIMPLE MED: 2.5000 [IU]/h | INTRAVENOUS | Status: DC

## 2019-04-16 MED ORDER — TERBUTALINE SULFATE 1 MG/ML IJ SOLN: 0.2500 mg | Freq: Once | INTRAMUSCULAR | Status: DC | PRN

## 2019-04-16 MED ORDER — PHENYLEPHRINE 40 MCG/ML (10ML) SYRINGE FOR IV PUSH (FOR BLOOD PRESSURE SUPPORT)
80.0000 ug | PREFILLED_SYRINGE | INTRAVENOUS | Status: DC | PRN
  Filled 2019-04-16: qty 10

## 2019-04-16 MED ORDER — LACTATED RINGERS IV SOLN
500.0000 mL | Freq: Once | INTRAVENOUS | Status: AC
  Administered 2019-04-16: 500 mL via INTRAVENOUS

## 2019-04-16 MED ORDER — FENTANYL-BUPIVACAINE-NACL 0.5-0.125-0.9 MG/250ML-% EP SOLN: 12.0000 mL/h | EPIDURAL | Status: DC | PRN

## 2019-04-16 MED ORDER — ACETAMINOPHEN 325 MG PO TABS: 650.0000 mg | ORAL_TABLET | ORAL | Status: DC | PRN

## 2019-04-16 MED ORDER — LIDOCAINE-EPINEPHRINE (PF) 2 %-1:200000 IJ SOLN
INTRAMUSCULAR | Status: DC | PRN
  Administered 2019-04-16 (×2): 5 mL via EPIDURAL
  Administered 2019-04-17: 3 mL via EPIDURAL
  Administered 2019-04-17: 4 mL via EPIDURAL
  Administered 2019-04-17: 3 mL via EPIDURAL

## 2019-04-16 MED ORDER — FENTANYL CITRATE (PF) 100 MCG/2ML IJ SOLN
50.0000 ug | INTRAMUSCULAR | Status: DC | PRN
  Administered 2019-04-16: 100 ug via INTRAVENOUS
  Filled 2019-04-16: qty 2

## 2019-04-16 MED ORDER — LACTATED RINGERS IV SOLN
500.0000 mL | INTRAVENOUS | Status: DC | PRN
  Administered 2019-04-16: 500 mL via INTRAVENOUS

## 2019-04-16 MED ORDER — OXYTOCIN BOLUS FROM INFUSION
500.0000 mL | Freq: Once | INTRAVENOUS | Status: AC
  Administered 2019-04-17: 500 mL via INTRAVENOUS

## 2019-04-16 MED ORDER — OXYTOCIN 10 UNIT/ML IJ SOLN: 10.0000 [IU] | Freq: Once | INTRAMUSCULAR | Status: DC

## 2019-04-16 MED ORDER — SOD CITRATE-CITRIC ACID 500-334 MG/5ML PO SOLN: 30.0000 mL | ORAL | Status: DC | PRN

## 2019-04-16 MED ORDER — LACTATED RINGERS IV SOLN: INTRAVENOUS | Status: DC

## 2019-04-16 MED ORDER — EPHEDRINE 5 MG/ML INJ
10.0000 mg | INTRAVENOUS | Status: DC | PRN
  Filled 2019-04-16: qty 10

## 2019-04-16 MED ORDER — ONDANSETRON HCL 4 MG/2ML IJ SOLN: 4.0000 mg | Freq: Four times a day (QID) | INTRAMUSCULAR | Status: DC | PRN

## 2019-04-16 MED ORDER — SODIUM CHLORIDE (PF) 0.9 % IJ SOLN
INTRAMUSCULAR | Status: DC | PRN
  Administered 2019-04-16: 12 mL/h via EPIDURAL

## 2019-04-16 MED ORDER — FENTANYL-BUPIVACAINE-NACL 0.5-0.125-0.9 MG/250ML-% EP SOLN
12.0000 mL/h | EPIDURAL | Status: DC | PRN
  Filled 2019-04-16 (×2): qty 250

## 2019-04-16 NOTE — Anesthesia Procedure Notes (Signed)
Epidural Patient location during procedure: OB Start time: 04/16/2019 5:37 PM End time: 04/16/2019 5:49 PM  Staffing Anesthesiologist: Bethena Midget, MD  Preanesthetic Checklist Completed: patient identified, IV checked, site marked, risks and benefits discussed, surgical consent, monitors and equipment checked, pre-op evaluation and timeout performed  Epidural Patient position: sitting Prep: DuraPrep and site prepped and draped Patient monitoring: continuous pulse ox and blood pressure Approach: midline Location: L3-L4 Injection technique: LOR air  Needle:  Needle type: Tuohy  Needle gauge: 17 G Needle length: 9 cm and 9 Needle insertion depth: 9 cm Catheter type: closed end flexible Catheter size: 19 Gauge Catheter at skin depth: 14 cm Test dose: negative  Assessment Events: blood not aspirated, injection not painful, no injection resistance, no paresthesia and negative IV test

## 2019-04-16 NOTE — Progress Notes (Signed)
[name], female   DOB: 01/04/1991, 29 y.o.   MRN: [id number] S: At bedside for fetal resuscitation. Patient was feeling pain and pressure despite epidural, had relief on and off with PCA bolus several times but not effective. Epidural re-dosed by Dr. Malen Gauze with subsequent pain relief. Hypotension ensued with fetal brady following.   O: Vitals:   04/16/19 2231 04/16/19 2234 04/16/19 2241 04/16/19 2248  BP: (!) 109/53 115/69 (!) 144/130 (!) 120/98  Pulse: (!) 101 86 (!) 184   Resp:      Temp:      TempSrc:      SpO2:      Weight:      Height:         FHT:  FHR: 150 bpm, variability: minimal ,  accelerations:  Abscent,  decelerations:  Present prolonged decel x 10 min, resolved with position changes, ephedrine, phenylephrine, and fluid bolus. UC:   regular, every 2-3 min Pitocin DCed during resuscitative measures. Fetal scalp electrode applied. FHR recovered to 150 BPM, minimal variability, no accels, occasional small variables, overall reassuring SVE:   Dilation: 6.5 Effacement (%): 100 Station: -1 Exam by:: Mary [place] Johnson, RN    A / P: Protracted active phase Fetal brady d/t post-epidural hypotension, resolved Will monitor closely, Pitocin off for now.   Fetal Wellbeing:  Category II Pain Control:  Epidural, redosed for patient comfort  Anticipated MOD:  NSVD / guarded  Will update Dr. Sallye Ober with above.  Neta Mends, CNM, MSN 04/16/2019, 10:51 PM

## 2019-04-16 NOTE — Anesthesia Preprocedure Evaluation (Signed)
Anesthesia Evaluation  Patient identified by MRN, date of birth, ID band Patient awake    Reviewed: Allergy & Precautions, H&P , NPO status , Patient's Chart, lab work & pertinent test results, reviewed documented beta blocker date and time   Airway Mallampati: II  TM Distance: >3 FB Neck ROM: full    Dental no notable dental hx. (+) Teeth Intact, Dental Advisory Given   Pulmonary neg pulmonary ROS,    Pulmonary exam normal breath sounds clear to auscultation       Cardiovascular negative cardio ROS Normal cardiovascular exam Rhythm:regular Rate:Normal     Neuro/Psych negative neurological ROS  negative psych ROS   GI/Hepatic negative GI ROS, Neg liver ROS,   Endo/Other  Morbid obesity  Renal/GU negative Renal ROS  negative genitourinary   Musculoskeletal   Abdominal (+) + obese,   Peds  Hematology negative hematology ROS (+)   Anesthesia Other Findings   Reproductive/Obstetrics (+) Pregnancy                             Anesthesia Physical Anesthesia Plan  ASA: II  Anesthesia Plan: Epidural   Post-op Pain Management:    Induction:   PONV Risk Score and Plan:   Airway Management Planned:   Additional Equipment:   Intra-op Plan:   Post-operative Plan:   Informed Consent: I have reviewed the patients History and Physical, chart, labs and discussed the procedure including the risks, benefits and alternatives for the proposed anesthesia with the patient or authorized representative who has indicated his/her understanding and acceptance.       Plan Discussed with: Anesthesiologist  Anesthesia Plan Comments:         Anesthesia Quick Evaluation

## 2019-04-16 NOTE — Progress Notes (Signed)
[name], female   DOB: [birth year], 29 y.o.   MRN: [id number] Felicia Hatfield is a 29 y.o. G2P0010 at [redacted]w[redacted]d by ultrasound admitted for early labor on same day as planned IOL for late term.  Subjective: Comfortable with epidural placed at 1730, had tried one dose Fentanyl and minimal relief obtained.  Denies HA/NV/RUQ pain. Spouse Gerilyn Pilgrim at [facility] and supportive.   Objective: Vitals:   04/16/19 1756 04/16/19 1811 04/16/19 1816 04/16/19 1831  BP: 127/63 126/75 118/69 118/73  Pulse: 86 87 81 (!) 168  Resp:      Temp:      TempSrc:      SpO2:      Weight:      Height:        No intake/output data recorded. Total I/O In: 405.8 [I.V.:405.8] Out: -    FHT:  FHR: 145 bpm, variability: moderate,  accelerations:  Present,  decelerations:  Present early UC:   regular, every 2-3 minutes SVE:   Dilation: 4 Effacement (%): 100 Station: -1 Exam by:: Arlan Organ, CNM  BBOW AROM clear AF with exam Labs:   Recent Labs    04/16/19 1235  WBC 9.5  HGB 13.0  HCT 39.6  PLT 217    Assessment / Plan: Augmentation of labor, progressing well  Labor: Progressing on Pitocin, will continue to increase PRN, position changes q 30 min with peanut ball to facilitate fetal rotation and descent Preeclampsia:  no signs or symptoms of toxicity Fetal Wellbeing:  Category I Pain Control:  Epidural I/D:  n/a Anticipated MOD:  NSVD  Neta Mends, CNM, MSN 04/16/2019, 6:42 PM

## 2019-04-16 NOTE — H&P (Signed)
OB ADMISSION/ HISTORY & PHYSICAL:  Admission Date: 04/16/2019 10:56 AM  Admit Diagnosis: Spotting Cramping     Felicia Hatfield is a 29 y.o. female presenting for early labor ctx, and r/o SROM. Reports feeling a"pop" sensation this AM at 0945, then small amount of fluid with some spotting while on toilet. None seen since then, cramping and contractions started shortly after. Reports good FM, coping well with cramping discomfort.   Patient was scheduled for IOL tonight for late term.  Lives remotely in [place]. Planning epidural in active labor. Spouse is with her and supportive. Planning to breastfeed. Desires circ for newborn outpatient on day 8 of life.  Prenatal History: G2P0010   EDC : 04/11/2019, by Last Menstrual Period  Prenatal care at [facility] since 15 weeks Primary Dr. Charlesetta Garibaldi   Prenatal course complicated by: BMI 39 Heart palpitations during pregnancy, cardio consult done, no intervention, plan F/U cardio postpartum Hx molar pregnancy 2019, D&E and F/U quants to negative ASCUS pap with + HPV 2018, ASCUS pap and negative HPV 2020 with normal colpo, plan rpt colpo PP.  Declined Flu and TdaP vax  Prenatal Labs: ABO, Rh: O (08/17 0000)  Antibody: Negative (08/17 0000) Rubella: Immune (08/17 0000)  RPR: Nonreactive (08/17 0000)  HBsAg: Negative (08/17 0000)  HIV: Non-reactive (08/17 0000)  GBS:  negative  1 hr Glucola : 92 Genetic Screening: Panorama and AFP1 normal Ultrasound: normal XY anatomy, posterior placenta, EIF x 2 L ventricle    Maternal Diabetes: No Genetic Screening: Normal Maternal Ultrasounds/Referrals: Normal Fetal Ultrasounds or other Referrals:  None Maternal Substance Abuse:  No Significant Maternal Medications:  None Significant Maternal Lab Results:  Group B Strep negative Other Comments:  None  Medical / Surgical History :  Past medical history:  Past Medical History:  Diagnosis Date  . HSV (herpes simplex virus)  anogenital infection   . IBS (irritable bowel syndrome)   . Vaginal Pap smear, abnormal    positive HPV in previous papsmears     Past surgical history:  Past Surgical History:  Procedure Laterality Date  . DILATION AND CURETTAGE OF UTERUS    . DILATION AND EVACUATION N/A 02/25/2018   Procedure: SUCTION DILATATION AND EVACUATION WITH ULTRASOUND GUIDANCE;  Surgeon: Crawford Givens, MD;  Location: [facility];  Service: Gynecology;  Laterality: N/A;     Family History:  Family History  Problem Relation Age of Onset  . Healthy Mother      Social History:  reports that she has never smoked. She has never used smokeless tobacco. She reports current alcohol use. She reports that she does not use drugs.   Allergies: Patient has no known allergies.   Current Medications at time of admission:  Medications Prior to Admission  Medication Sig Dispense Refill Last Dose  . hydrocortisone (ANUSOL-HC) 25 MG suppository Place 1 suppository (25 mg total) rectally 2 (two) times daily. 12 suppository 0   . ibuprofen (ADVIL,MOTRIN) 600 MG tablet Take 1 tablet (600 mg total) by mouth every 6 (six) hours as needed. 30 tablet 0   . oxycodone-acetaminophen (PERCOCET) 2.5-325 MG tablet Take 1 tablet by mouth every 6 (six) hours as needed for pain. 20 tablet 0   . oxycodone-acetaminophen (PERCOCET) 2.5-325 MG tablet Take 1 tablet by mouth every 6 (six) hours as needed for pain. 20 tablet 0      Review of Systems: ROS  As noted above  Physical Exam: Vital signs and nursing notes reviewed.  ED Triage  Vitals  Enc Vitals Group     BP 04/16/19 1109 130/80     Pulse Rate 04/16/19 1109 85     Resp 04/16/19 1109 16     Temp 04/16/19 1109 98.8 F (37.1 C)     Temp Source 04/16/19 1109 Oral     SpO2 04/16/19 1109 100 %     Weight 04/16/19 1109 224 lb 11.2 oz (101.9 kg)     Height 04/16/19 1109 5\' 3"  (1.6 m)     Head Circumference --      Peak Flow --      Pain Score 04/16/19 1108 3     Pain Loc --       Pain Edu? --      Excl. in GC? --      General: AAO x 3, NAD, coping well w/ ctx Heart: RRR Lungs:CTAB Abdomen: Gravid, NT, Leopold's vertex, fetal spine to maternal R Extremities: trace edema Genitalia / VE: Dilation: 2 Effacement (%): 80 Station: -2 Presentation: Vertex Exam by:: [email or web address], CNM  BOW palpated, no fluid or VB in vagina  FHR:  150 BPM, mod variability, + accels, no decels TOCO: Ctx q3-5 min, mild  Labs:   Pending T&S, CBC, RPR  No results for input(s): WBC, HGB, HCT, PLT in the last 72 hours.     Assessment:  29 y.o. G2P0010 at [redacted]w[redacted]d w/ ctx, no SROM Planned IOL for late term  1. Early stage of labor 2. FHR category 1 3. GBS neg 4. Desires epidural in labor 5. Breastfeeding planned 6. Desires circ outpatient  Plan:  1. Admit to BS 2. Routine L&D orders, start labor augmentation as patient lives remotely 3. Analgesia/anesthesia PRN  4. Anticipate NSVB   Dr [redacted]w[redacted]d notified of admission / plan of care   Su Hilt CNM, MSN 04/16/2019, 12:07 PM

## 2019-04-16 NOTE — MAU Note (Signed)
Felt a pop when she was laying down, thought maybe her water broke.  Saw spots of blood.  Thinks she is now contracting, every 5-6 min.  Was to be induced tonight.  No problems with preg, lost her plug last wik, no dilation.

## 2019-04-17 ENCOUNTER — Inpatient Hospital Stay (HOSPITAL_COMMUNITY): Payer: BC Managed Care – PPO

## 2019-04-17 ENCOUNTER — Encounter (HOSPITAL_COMMUNITY): Payer: Self-pay | Admitting: Obstetrics and Gynecology

## 2019-04-17 ENCOUNTER — Inpatient Hospital Stay (HOSPITAL_COMMUNITY)
Admission: AD | Admit: 2019-04-17 | Payer: BC Managed Care – PPO | Source: Home / Self Care | Admitting: Obstetrics & Gynecology

## 2019-04-17 LAB — RPR: RPR Ser Ql: NONREACTIVE

## 2019-04-17 MED ORDER — SIMETHICONE 80 MG PO CHEW: 80.0000 mg | CHEWABLE_TABLET | ORAL | Status: DC | PRN

## 2019-04-17 MED ORDER — BENZOCAINE-MENTHOL 20-0.5 % EX AERO
1.0000 "application " | INHALATION_SPRAY | CUTANEOUS | Status: DC | PRN
  Administered 2019-04-17 – 2019-04-18 (×2): 1 via TOPICAL
  Filled 2019-04-17 (×2): qty 56

## 2019-04-17 MED ORDER — SODIUM CHLORIDE 0.9 % IV SOLN
3.0000 g | Freq: Once | INTRAVENOUS | Status: AC
  Administered 2019-04-17: 3 g via INTRAVENOUS
  Filled 2019-04-17: qty 8

## 2019-04-17 MED ORDER — FENTANYL CITRATE (PF) 100 MCG/2ML IJ SOLN
INTRAMUSCULAR | Status: AC
  Filled 2019-04-17: qty 2

## 2019-04-17 MED ORDER — SODIUM CHLORIDE 0.9 % IV SOLN
3.0000 g | Freq: Four times a day (QID) | INTRAVENOUS | Status: DC
  Administered 2019-04-17 – 2019-04-18 (×4): 3 g via INTRAVENOUS
  Filled 2019-04-17 (×2): qty 8
  Filled 2019-04-17 (×2): qty 3
  Filled 2019-04-17 (×2): qty 8
  Filled 2019-04-17: qty 3

## 2019-04-17 MED ORDER — OXYCODONE-ACETAMINOPHEN 5-325 MG PO TABS: 2.0000 | ORAL_TABLET | ORAL | Status: DC | PRN

## 2019-04-17 MED ORDER — ACETAMINOPHEN 500 MG PO TABS
1000.0000 mg | ORAL_TABLET | Freq: Four times a day (QID) | ORAL | Status: DC | PRN
  Administered 2019-04-18 – 2019-04-19 (×4): 1000 mg via ORAL
  Filled 2019-04-17 (×4): qty 2

## 2019-04-17 MED ORDER — ZOLPIDEM TARTRATE 5 MG PO TABS: 5.0000 mg | ORAL_TABLET | Freq: Every evening | ORAL | Status: DC | PRN

## 2019-04-17 MED ORDER — IBUPROFEN 600 MG PO TABS
600.0000 mg | ORAL_TABLET | Freq: Four times a day (QID) | ORAL | Status: DC
  Administered 2019-04-17 – 2019-04-18 (×6): 600 mg via ORAL
  Filled 2019-04-17 (×6): qty 1

## 2019-04-17 MED ORDER — OXYCODONE-ACETAMINOPHEN 5-325 MG PO TABS
1.0000 | ORAL_TABLET | ORAL | Status: DC | PRN
  Administered 2019-04-18: 1 via ORAL
  Filled 2019-04-17: qty 1

## 2019-04-17 MED ORDER — DIBUCAINE (PERIANAL) 1 % EX OINT: 1.0000 "application " | TOPICAL_OINTMENT | CUTANEOUS | Status: DC | PRN

## 2019-04-17 MED ORDER — INFLUENZA VAC SPLIT QUAD 0.5 ML IM SUSY: 0.5000 mL | PREFILLED_SYRINGE | INTRAMUSCULAR | Status: DC

## 2019-04-17 MED ORDER — PRENATAL MULTIVITAMIN CH
1.0000 | ORAL_TABLET | Freq: Every day | ORAL | Status: DC
  Administered 2019-04-17 – 2019-04-19 (×3): 1 via ORAL
  Filled 2019-04-17 (×3): qty 1

## 2019-04-17 MED ORDER — FENTANYL CITRATE (PF) 100 MCG/2ML IJ SOLN
INTRAMUSCULAR | Status: DC | PRN
  Administered 2019-04-17: 100 ug via EPIDURAL

## 2019-04-17 MED ORDER — SENNOSIDES-DOCUSATE SODIUM 8.6-50 MG PO TABS
2.0000 | ORAL_TABLET | ORAL | Status: DC
  Administered 2019-04-18 (×2): 2 via ORAL
  Filled 2019-04-17 (×2): qty 2

## 2019-04-17 MED ORDER — WITCH HAZEL-GLYCERIN EX PADS: 1.0000 "application " | MEDICATED_PAD | CUTANEOUS | Status: DC | PRN

## 2019-04-17 MED ORDER — ONDANSETRON HCL 4 MG/2ML IJ SOLN: 4.0000 mg | INTRAMUSCULAR | Status: DC | PRN

## 2019-04-17 MED ORDER — ACETAMINOPHEN 500 MG PO TABS
1000.0000 mg | ORAL_TABLET | Freq: Four times a day (QID) | ORAL | Status: DC | PRN
  Administered 2019-04-17 (×2): 1000 mg via ORAL
  Filled 2019-04-17 (×2): qty 2

## 2019-04-17 MED ORDER — DIPHENHYDRAMINE HCL 25 MG PO CAPS: 25.0000 mg | ORAL_CAPSULE | Freq: Four times a day (QID) | ORAL | Status: DC | PRN

## 2019-04-17 MED ORDER — ONDANSETRON HCL 4 MG PO TABS: 4.0000 mg | ORAL_TABLET | ORAL | Status: DC | PRN

## 2019-04-17 MED ORDER — COCONUT OIL OIL: 1.0000 "application " | TOPICAL_OIL | Status: DC | PRN

## 2019-04-17 NOTE — Plan of Care (Signed)
  Problem: Activity: Goal: Risk for activity intolerance will decrease Outcome: Completed/Met   Problem: Pain Managment: Goal: General experience of comfort will improve Outcome: Completed/Met   Problem: Skin Integrity: Goal: Risk for impaired skin integrity will decrease Outcome: Completed/Met   Problem: Education: Goal: Knowledge of condition will improve Outcome: Completed/Met   Problem: Life Cycle: Goal: Chance of risk for complications during the postpartum period will decrease Outcome: Completed/Met   Problem: Role Relationship: Goal: Ability to demonstrate positive interaction with newborn will improve Outcome: Completed/Met   Problem: Skin Integrity: Goal: Demonstration of wound healing without infection will improve Outcome: Completed/Met

## 2019-04-17 NOTE — Anesthesia Postprocedure Evaluation (Signed)
Anesthesia Post Note  Patient: Location manager  Procedure(s) Performed: AN AD HOC LABOR EPIDURAL     Patient location during evaluation: Mother Baby Anesthesia Type: Epidural Level of consciousness: awake and alert Pain management: pain level controlled Vital Signs Assessment: post-procedure vital signs reviewed and stable Respiratory status: spontaneous breathing, nonlabored ventilation and respiratory function stable Cardiovascular status: stable Postop Assessment: no headache, no backache and epidural receding Anesthetic complications: no    Last Vitals:  Vitals:   04/17/19 0928 04/17/19 1030  BP: 125/71 119/72  Pulse: (!) 104 (!) 106  Resp: 17 18  Temp: 37.7 C 37.8 C  SpO2: 100% 98%    Last Pain:  Vitals:   04/17/19 1030  TempSrc: Oral  PainSc: 0-No pain                 Deloyce Walthers

## 2019-04-17 NOTE — Lactation Note (Signed)
This note was copied from a baby's chart. Lactation Consultation Note  Patient Name: Felicia Hatfield [id number] Date: 04/17/2019 Reason for consult: Initial assessment;Primapara;1st time breastfeeding;Term  I agree with everything documented by [facility] student Vear Clock, I was present with her during [facility] consultation.  Maternal Data Formula Feeding for Exclusion: No Has patient been taught Hand Expression?: Yes Does the patient have breastfeeding experience prior to this delivery?: No  Feeding    LATCH Score Latch: Repeated attempts needed to sustain latch, nipple held in mouth throughout feeding, stimulation needed to elicit sucking reflex.  Audible Swallowing: A few with stimulation  Type of Nipple: Everted at rest and after stimulation  Comfort (Breast/Nipple): Soft / non-tender  Hold (Positioning): Assistance needed to correctly position infant at breast and maintain latch.  LATCH Score: 7  Interventions Interventions: Breast feeding basics reviewed;Hand express;Pre-pump if needed;Breast compression;Breast massage  Lactation Tools Discussed/Used Tools: Pump Breast pump type: Manual WIC Program: No Initiated by:: RN Date initiated:: 04/17/19   Consult Status Consult Status: Follow-up Date: 04/18/19 Follow-up type: In-patient    Devonta Blanford Venetia Constable 04/17/2019, 9:45 PM

## 2019-04-17 NOTE — Progress Notes (Signed)
Dr. Sallye Ober, MD requested placenta be sent to pathology approximately 30 minutes after delivery. Surgical tech was called to send and order put in. ST stated she had already thrown away the placenta. At 1645 another ST found placenta and sent. Note per Sallye Ober, MD.

## 2019-04-17 NOTE — Progress Notes (Signed)
Patient ID: Octavie Westerhold, female   DOB: 1990-07-31, 29 y.o.   MRN: [id number] S: Still feeling pressure w/ ctx, able to breath through them.    O: Vitals:   04/16/19 2331 04/17/19 0001 04/17/19 0030 04/17/19 0031  BP: (!) 123/106 (!) 125/56 105/67 105/67  Pulse:  100 (!) 123 (!) 123  Resp:  16 16   Temp:   100 F (37.8 C)   TempSrc:   Axillary   SpO2:      Weight:      Height:         FHT:  FHR: 160 bpm, variability: moderate,  accelerations:  Abscent,  decelerations:  Present early, occasional variables.  Repeated prolonged decel x 4 min after restarting Pitocin at half dose (3MU/MIN), now recovered to BL 160  UC:   regular, every 2-3 minutes SVE:   Dilation: 7 Effacement (%): 100 Station: -1 Exam by:: Arlan Organ, CNM  IUPC placed, patient tolerating well.   A / P: Protracted active phase Will continue expectant management, assess MVU over next couple hours Discussed possibility of emergent C/S if no progress with adequate MVU's or if FITL. All questions answered. Fetal Wellbeing:  Category II Pain Control:  Epidural  Low grade temp, APAP given  Anticipated MOD:  guarded   POC and patient status reviewed w/ Dr. Mare Ferrari, CNM, MSN 04/17/2019, 12:54 AM

## 2019-04-17 NOTE — Plan of Care (Signed)
  Problem: Activity: Goal: Risk for activity intolerance will decrease Outcome: Completed/Met   Problem: Pain Managment: Goal: General experience of comfort will improve Outcome: Completed/Met   Problem: Skin Integrity: Goal: Risk for impaired skin integrity will decrease Outcome: Completed/Met   Problem: Education: Goal: Knowledge of condition will improve Outcome: Completed/Met   Problem: Life Cycle: Goal: Chance of risk for complications during the postpartum period will decrease Outcome: Completed/Met   Problem: Role Relationship: Goal: Ability to demonstrate positive interaction with newborn will improve Outcome: Completed/Met   Problem: Skin Integrity: Goal: Demonstration of wound healing without infection will improve Outcome: Completed/Met   Problem: Activity: Goal: Will verbalize the importance of balancing activity with adequate rest periods Outcome: Completed/Met Goal: Ability to tolerate increased activity will improve Outcome: Completed/Met   Problem: Life Cycle: Goal: Chance of risk for complications during the postpartum period will decrease Outcome: Completed/Met   Problem: Role Relationship: Goal: Ability to demonstrate positive interaction with newborn will improve Outcome: Completed/Met   Problem: Skin Integrity: Goal: Demonstration of wound healing without infection will improve Outcome: Completed/Met

## 2019-04-17 NOTE — Lactation Note (Signed)
This note was copied from a baby's chart. Lactation Consultation Note  Patient Name: Felicia Hatfield [id number] Date: 04/17/2019 Reason for consult: Initial assessment;Primapara;1st time breastfeeding;Term  Initital visit- Mother and Father resting when [facility] M. Philis Nettle and [facility] student entered the room. Infant asleep in the bassinet. Mother, P1 stated she is familiar with lactation services and would love for LC to observe infant feeding. Mother states breastfeeding has been a little painful when latching infant to nurse. However, she did not want to wake infant up since he is finally resting. LC student assessed mom's breast for any nipple trauma. [facility] student did not see any damage to the breast. LC student encouraged mom to call when she is ready for assistance with feed if needed. Mom stated she will call [facility] or Nurse when baby is ready to nurse. She also stated that the nurse gave her a hand pump to encourage milk production and elongate nipples.   [facility] student educated mom on hand expression. Mom stated she was aware and plans to hand express before nursing infant. Mom stated she noticed breast changes of bigger areolas during the pregnancy. After a few attempts of hand expression, no colostrum noted during this time; but mom was able to do teach back. [facility] student encouraged mom to continue breast massages, compressions, hand expression, and pre-pump to elongate nipples as well. LC student offered to set up DEBP due to infant DAT+ status. Mom declined, stated is not ready to pump at this time. LC encouraged mom to call her RN when she is ready to start using the DEBP.   13 hours old, 6 lbs 13.5 oz, Term infant asleep during Lactation visit. Mom suggested LC come back later to assess infant during a feed when he is awake.   Reviewed hand expression, breast compressions, breast massages,  breastfeeding basics, and outpatient lactation services. Mom stated she was aware of all information and plans to call [facility] or her  Nurse for assistance with feeding as needed. No further questions or concerns from Mom at this time.    Maternal Data Formula Feeding for Exclusion: No Has patient been taught Hand Expression?: Yes Does the patient have breastfeeding experience prior to this delivery?: No  Feeding    LATCH Score Latch: Repeated attempts needed to sustain latch, nipple held in mouth throughout feeding, stimulation needed to elicit sucking reflex.  Audible Swallowing: A few with stimulation  Type of Nipple: Everted at rest and after stimulation  Comfort (Breast/Nipple): Soft / non-tender  Hold (Positioning): Assistance needed to correctly position infant at breast and maintain latch.  LATCH Score: 7  Interventions Interventions: Breast feeding basics reviewed;Hand express;Pre-pump if needed;Breast compression;Breast massage  Lactation Tools Discussed/Used Tools: Pump Breast pump type: Manual WIC Program: No Initiated by:: RN Date initiated:: 04/17/19   Consult Status Consult Status: Follow-up Date: 04/18/19 Follow-up type: In-patient    Felicia Hatfield 04/17/2019, 9:23 PM

## 2019-04-17 NOTE — Progress Notes (Signed)
[name], female   DOB: 01-Dec-1990, 29 y.o.   MRN: [id number] S: Lots of pressure w/ ctx. Epidural re dosed a second time during night with sub optimal relief.    O: Vitals:   04/17/19 0501 04/17/19 0531 04/17/19 0620 04/17/19 0630  BP: 127/90 127/60  132/71  Pulse: 79 (!) 104  (!) 117  Resp: 16 16  17   Temp:   (!) 102.1 F (38.9 C)   TempSrc:   Axillary   SpO2:      Weight:      Height:         FHT:  FHR: 180 bpm, variability: moderate,  accelerations:  Abscent,  decelerations:  Present variable with contractions, late recovery with last few. UC:   irregular, every 2-4 minutes, coupling SVE:   Dilation: 10 Effacement (%): 100 Station: Plus 2 Exam by:: D. Marlette Curvin Pushing started  A / P: Protracted active phase  Fetal intolerance to pitocin, slow progress from 7 cm, Pitocin off after restart attempt prompted a second prolonged decel. Good progress in second stage with guided pushing but fetal tachy noted.  Dr. [email or web address] notified, and patient counseled for vacuum attempt  Fetal Wellbeing:  Category II  Maternal temp 102.1, low grade through the night Tylenol 1 gm PO and Unasyn 3 gm IVP for presumptive chorio given.  Pain Control:  Epidural  Anticipated MOD:  NSVD/VAVD  Sallye Ober, CNM, MSN 04/17/2019, 6:50 AM

## 2019-04-18 ENCOUNTER — Encounter (HOSPITAL_COMMUNITY): Payer: Self-pay | Admitting: Obstetrics & Gynecology

## 2019-04-18 LAB — CBC
HCT: 30.6 % — ABNORMAL LOW (ref 36.0–46.0)
Hemoglobin: 10.3 g/dL — ABNORMAL LOW (ref 12.0–15.0)
MCH: 31.7 pg (ref 26.0–34.0)
MCHC: 33.7 g/dL (ref 30.0–36.0)
MCV: 94.2 fL (ref 80.0–100.0)
Platelets: 164 10*3/uL (ref 150–400)
RBC: 3.25 MIL/uL — ABNORMAL LOW (ref 3.87–5.11)
RDW: 14.2 % (ref 11.5–15.5)
WBC: 23.6 10*3/uL — ABNORMAL HIGH (ref 4.0–10.5)
nRBC: 0 % (ref 0.0–0.2)

## 2019-04-18 MED ORDER — IBUPROFEN 600 MG PO TABS
600.0000 mg | ORAL_TABLET | Freq: Four times a day (QID) | ORAL | Status: DC
  Administered 2019-04-18 – 2019-04-19 (×3): 600 mg via ORAL
  Filled 2019-04-18 (×3): qty 1

## 2019-04-18 NOTE — Progress Notes (Signed)
Subjective: Postpartum Day # 1 : S/P VAVD due to SROM with normal early labor. Patient up ad lib, denies syncope or dizziness. Reports consuming regular diet without issues and denies N/V. Patient reports 0 bowel movement + passing flatus.  Denies issues with urination and reports bleeding is "lighter."  Patient is breastfeeding and reports going well.  Desires undecided for postpartum contraception.  Pain is being appropriately managed with use of po meds.   1st degree laceration Feeding:  Breast/BT Contraceptive plan:  undeicded BB: Circ out pt desires, newborn currently under bili light.   Objective: Vital signs in last 24 hours: Patient Vitals for the past 24 hrs:  BP Temp Temp src Pulse Resp SpO2  04/18/19 0930 -- 98.3 F (36.8 C) Oral -- -- --  04/18/19 0539 119/72 98 F (36.7 C) Oral 91 18 --  04/17/19 2156 105/62 98.5 F (36.9 C) Oral 76 18 --  04/17/19 1742 (!) 109/58 98.4 F (36.9 C) Oral 79 16 100 %  04/17/19 1430 121/77 99.6 F (37.6 C) Oral 85 16 100 %  04/17/19 1300 -- 99 F (37.2 C) Oral -- -- --     Physical Exam:  General: alert, cooperative, appears stated age and no distress Mood/Affect: Happy Lungs: clear to auscultation, no wheezes, rales or rhonchi, symmetric air entry.  Heart: normal rate, regular rhythm, normal S1, S2, no murmurs, rubs, clicks or gallops. Breast: breasts appear normal, no suspicious masses, no skin or nipple changes or axillary nodes. Abdomen:  + bowel sounds, soft, non-tender GU: perineum approximate, healing well. No signs of external hematomas.  Uterine Fundus: firm Lochia: appropriate Skin: Warm, Dry. DVT Evaluation: No evidence of DVT seen on physical exam. Negative Homan's sign. No cords or calf tenderness. No significant calf/ankle edema.  CBC Latest Ref Rng & Units 04/18/2019 04/16/2019 02/25/2018  WBC 4.0 - 10.5 K/uL 23.6(H) 9.5 6.9  Hemoglobin 12.0 - 15.0 g/dL 10.3(L) 13.0 12.4  Hematocrit 36.0 - 46.0 % 30.6(L) 39.6 36.9   Platelets 150 - 400 K/uL 164 217 304    Results for orders placed or performed during the hospital encounter of 04/16/19 (from the past 24 hour(s))  CBC     Status: Abnormal   Collection Time: 04/18/19  4:33 AM  Result Value Ref Range   WBC 23.6 (H) 4.0 - 10.5 K/uL   RBC 3.25 (L) 3.87 - 5.11 MIL/uL   Hemoglobin 10.3 (L) 12.0 - 15.0 g/dL   HCT 30.6 (L) 36.0 - 46.0 %   MCV 94.2 80.0 - 100.0 fL   MCH 31.7 26.0 - 34.0 pg   MCHC 33.7 30.0 - 36.0 g/dL   RDW 14.2 11.5 - 15.5 %   Platelets 164 150 - 400 K/uL   nRBC 0.0 0.0 - 0.2 %     CBG (last 3)  No results for input(s): GLUCAP in the last 72 hours.   I/O last 3 completed shifts: In: -  Out: 1764 [Urine:1475; Blood:289]   Assessment Postpartum Day # 1 : S/P VAVD due to SROM with spontaneous early labor. Pt stable. -1 involution. Breast and bottle feeding. Hemodynamically stable with hgb drop of 13-10.3. Low grade fever during delivery: 24 hours unasyn given, currently IV d/c'ed, pt been afebrile x 24 hours.   Plan: Continue other mgmt as ordered VTE prophylactics: Early ambulated as tolerates.  Pain control: Motrin/Tylenol PRN Education given regarding options for contraception, including barrier methods, injectable contraception, IUD placement, oral contraceptives.  Plan for discharge tomorrow, Breastfeeding, Lactation consult  and Contraception undecided   Dr. Sallye Ober to be updated on patient status  [facility] NP-C, CNM 04/18/2019, 12:33 PM

## 2019-04-18 NOTE — Lactation Note (Addendum)
This note was copied from a baby's chart. Lactation Consultation Note  Patient Name: Felicia Hatfield [id number] Date: 04/18/2019 Reason for consult: Follow-up assessment;Mother's request;Difficult latch P1, 20 hour female infant, DAT+ Mom is will  use donor  breast milk  to supplement with breastfeeding,  if infant's billi levels are high until her milk supply is established. LC will inform RN of mom's decision to supplement with donor milk if infant has high billi levels to put order in for donor breast milk.  RN will give parents supplemental breastfeeding guidelines for infant's based on infant's age and hours of life.  Mom started using DEBP this morning and plans to pump every 3 hours for 15 minutes on initial setting. Per mom, she has been having pain when infant is latching at the breast during feedings. LC assisted mom in latching infant at breast. Mom latched infant on right breast using the foot ball hold position. LC ask mom to pre-pump breast with hand pump, rub breast below infant's nose and wait until infant's mouth is wide and bring infant chin first to breast with nose and chin to breast. After a few attempts, infant latched and swallow observed, infant sustained latch, per mom, she did not feel pain nor pinching only a tug at breast. Mom understands to break latch if she feels pain and re-latch infant at breast.    Mom will continue to breastfeed according to hunger cues, 8 to 12 times within 24 hours on demand and not exceed 3 hours without breastfeeding infant. Mom knows to call RN or LC if she needs assistance with latching infant at breast. Mom and  Dad  shown how to use DEBP & how to disassemble, clean, & reassemble parts. Maternal Data    Feeding Feeding Type: Breast Fed  LATCH Score Latch: Grasps breast easily, tongue down, lips flanged, rhythmical sucking.  Audible Swallowing: A few with stimulation  Evert but short shafted  Comfort (Breast/Nipple): Soft /  non-tender  Hold (Positioning): Assistance needed to correctly position infant at breast and maintain latch.  LATCH score: 8  Interventions Interventions: Assisted with latch;Adjust position;Support pillows;Breast compression;Pre-pump if needed;Skin to skin;Position options;Hand pump  Lactation Tools Discussed/Used Tools: Pump Breast pump type: Manual   Consult Status Consult Status: Follow-up Date: 04/18/19 Follow-up type: In-patient    Danelle Earthly 04/18/2019, 3:59 AM

## 2019-04-19 MED ORDER — ACETAMINOPHEN 500 MG PO TABS: 1000.0000 mg | ORAL_TABLET | Freq: Four times a day (QID) | ORAL | 0 refills | Status: AC | PRN

## 2019-04-19 MED ORDER — IBUPROFEN 600 MG PO TABS: 600.0000 mg | ORAL_TABLET | Freq: Four times a day (QID) | ORAL | 0 refills | Status: AC

## 2019-04-19 NOTE — Discharge Summary (Signed)
OB Discharge Summary     Patient Name: Felicia Hatfield DOB: 1990-11-18 MRN: [id number]  Date of admission: 04/16/2019 Delivering MD: Waymon Amato   Date of discharge: 04/19/2019  Admitting diagnosis: Normal labor [O80, Z37.9] Vaginal delivery [O80] Intrauterine pregnancy: [redacted]w[redacted]d     Secondary diagnosis:  Active Problems:   Normal labor   Vaginal delivery   Vacuum-assisted vaginal delivery  Additional problems: HSV, IBS     Discharge diagnosis: Term Pregnancy Delivered                                                                                                Post partum procedures:none  Augmentation: AROM and Pitocin  Complications: None  Hospital course:  Onset of Early Labor With Vaginal Delivery     29 y.o. yo G2P1011 at [redacted]w[redacted]d was admitted in Latent Labor on 04/16/2019. Patient had an uncomplicated labor course as follows:  Membrane Rupture Time/Date: 6:35 PM ,04/16/2019   Intrapartum Procedures: Episiotomy: None [1]                                         Lacerations:  1st degree [2];Labial [10]  Patient had a delivery of a Viable infant. 04/17/2019  Information for the patient's newborn:  Lanita, Stammen [[id number]]       Pateint had an uncomplicated postpartum course.  She is ambulating, tolerating a regular diet, passing flatus, and urinating well. Patient is discharged home in stable condition on 04/19/19.   Physical exam  Vitals:   04/18/19 0930 04/18/19 1450 04/18/19 2030 04/19/19 0540  BP:  (!) 110/58 114/71 101/63  Pulse:  76 76 78  Resp:  18 18 18   Temp: 98.3 F (36.8 C) 99 F (37.2 C) 98.5 F (36.9 C) 98.2 F (36.8 C)  TempSrc: Oral Oral Oral Oral  SpO2:   100% 100%  Weight:      Height:       General: alert, cooperative and no distress Lochia: appropriate Uterine Fundus: firm Perineum: well-approximated, healing DVT Evaluation: No evidence of DVT seen on physical exam. No cords or calf tenderness. No significant calf/ankle edema. Labs: Lab  Results  Component Value Date   WBC 23.6 (H) 04/18/2019   HGB 10.3 (L) 04/18/2019   HCT 30.6 (L) 04/18/2019   MCV 94.2 04/18/2019   PLT 164 04/18/2019   CMP Latest Ref Rng & Units 04/19/2017  Glucose 65 - 99 mg/dL 95  BUN 6 - 20 mg/dL 11  Creatinine 0.44 - 1.00 mg/dL 0.60  Sodium 135 - 145 mmol/L 138  Potassium 3.5 - 5.1 mmol/L 3.6  Chloride 101 - 111 mmol/L 106  CO2 22 - 32 mmol/L 23  Calcium 8.9 - 10.3 mg/dL 8.9  Total Protein 6.5 - 8.1 g/dL -  Total Bilirubin 0.3 - 1.2 mg/dL -  Alkaline Phos 38 - 126 U/L -  AST 15 - 41 U/L -  ALT 14 - 54 U/L -    Discharge instruction: per After Visit Summary and "Baby and Me  Booklet".  After visit meds:  Allergies as of 04/19/2019   No Known Allergies     Medication List    STOP taking these medications   diphenhydrAMINE 25 MG tablet Commonly known as: BENADRYL     TAKE these medications   acetaminophen 500 MG tablet Commonly known as: TYLENOL Take 2 tablets (1,000 mg total) by mouth every 6 (six) hours as needed for fever. What changed:   medication strength  how much to take  reasons to take this   ferrous sulfate 325 (65 FE) MG tablet Take 325 mg by mouth daily with breakfast.   ibuprofen 600 MG tablet Commonly known as: ADVIL Take 1 tablet (600 mg total) by mouth every 6 (six) hours.   prenatal multivitamin Tabs tablet Take 1 tablet by mouth daily at 12 noon.       Diet: routine diet  Activity: Advance as tolerated. Pelvic rest for 6 weeks.   Outpatient follow up:6 weeks Follow up Appt: Future Appointments  Date Time Provider Department Center  06/02/2019  8:15 AM Runell Gess, MD [facility]   Follow up Visit:No follow-ups on file.  Postpartum contraception: Undecided  Newborn Data: Live born female  Birth Weight: 6 lb 13.5 oz (3105 g) APGAR: 6, 10  Newborn Delivery   Birth date/time: 04/17/2019 07:19:00 Delivery type: Vaginal, Vacuum (Extractor)      Baby Feeding: Bottle and  Breast Disposition:rooming in   04/19/2019 Roma Schanz, CNM

## 2019-04-19 NOTE — Lactation Note (Signed)
This note was copied from a baby's chart. Lactation Consultation Note  Patient Name: Felicia Hatfield [id number] Date: 04/19/2019 Reason for consult: Follow-up assessment;Term;Primapara;1st time breastfeeding   LC Follow Up Visit:  P1 mother whose infant is now 66 hours old.  This is a term baby with a 7% weight loss.  Baby is receiving phototherapy via bili blanket.  Bilirubin this a.m. was 11.9 mg/dl at 45 hours of life.  Mother has been providing donor breast milk as a supplementation.  Baby was asleep in mother's arms when I arrived.  She has been breast feeding and supplementing with donor breast milk as of 2100 last night.  Mother stated that baby cluster fed last night.  Encouraged her to continue feeding him with cues or at least every three hours due to being under phototherapy.  Suggested feeding STS and using gentle stimulation to keep him awake at the breast if he gets sleepy.  Mother is feeling better with latching and was excited to inform me that her mother (who breast fed 9 children) will be her support person for the next two weeks.  She will live with her for one week and then check in on her frequently during week two.  Mother feels hopeful that she will be successful.  Suggested mother continue using the donor breast milk while she is in the hospital today and to increase the supplementation volumes to 25-30 mls if baby desires.  Stressed the importance of volume intake to help lower bilirubin levels and minimize weight loss.  Mother verbalized understanding.  Mother will call her RN/LC at the next feed if assistance is required.  Mother has only pumped twice since initiating the DEBP; also suggested she pump every three hours and feed back any EBM she obtains to baby prior to using donor milk.  At this time mother is only obtaining colostrum drops.  Praised her efforts and provided reassurance that the EBM volume will increase with more frequent pumping and a few more days until the  milk comes to full volume.  Engorgement prevention/treatment reviewed.  Mother has a manual pump at bedside.  She is planning on obtaining a DEBP from her insurance company.  Father present.      Consult Status Consult Status: Complete Date: 04/19/19 Follow-up type: Call as needed    Felicia Hatfield R Felicia Hatfield 04/19/2019, 7:40 AM

## 2019-04-19 NOTE — Lactation Note (Signed)
This note was copied from a baby's chart. Lactation Consultation Note  Patient Name: Felicia Hatfield [id number] Date: 04/19/2019  P1, 41 hour female infant. Per mom, infant is latching well on left breast but she is still working on latch on right breast the RN"S have been very helpful. Mom is happy her mom is coming to help her when she is discharge from the hospital her mom breastfed nine children she is her support person.  Infant is currently on phototherapy mom is supplementing with donor breast milk Per mom, she given infant 8 mls of donor breast milk and breastfeed infant for 15 minutes prior to [facility] entering the room. Infant is starting to have lot of stools . She has used DEBP several times to day. Per mom, for infant's last  two feedings she gave infant donor breast milk first before breastfeeding because she was concerned about infant being jaundice. LC listen to mom's concerns and advised mom to breastfeed infant first to help establish her milk supply and work on infant latching at breast then offer donor milk after wards after feedings as supplement. Dad can offer donor milk to infant using the slow flow bottle nipple while mom continues to use DEBP to help with establishing her milk supply. Mom will continue to breastfeed infant according to hunger cues, 8 to 12 times within 24 hours, on demand and not exceed 3 hours without breastfeeding infant.      Maternal Data    Feeding Feeding Type: Donor Breast Milk Nipple Type: Slow - flow  LATCH Score                   Interventions    Lactation Tools Discussed/Used     Consult Status      Danelle Earthly 04/19/2019, 1:16 AM

## 2019-04-20 ENCOUNTER — Ambulatory Visit: Payer: Self-pay

## 2019-04-20 LAB — SURGICAL PATHOLOGY

## 2019-04-20 NOTE — Lactation Note (Signed)
This note was copied from a baby's chart. Lactation Consultation Note  Patient Name: Felicia Hatfield [id number] Date: 04/20/2019    Infant could be heard crying from hallway. Mom using a pacifier. More DBM obtained.    [name] [facility] 04/20/2019, 8:37 AM

## 2019-04-20 NOTE — Lactation Note (Signed)
This note was copied from a baby's chart. Lactation Consultation Note  Patient Name: Felicia Hatfield [id number] Date: 04/20/2019   Pecola Leisure was heard crying from the hallway again and [facility] and [facility] student entered room to assist.  Found mom sitting on the couch, changing baby.  Baby had a BM diaper. FOB expressed concern that mom had only slept 3 hours since baby was born. Mom again expressed concern over her supply and ability to meet the baby's needs.  Mom was ready to attempt to latch baby to BF. Mom removed eye covering and placed baby in football hold to feed. Great latch observed and some swallows were heard by [facility] and [facility] student using stethoscope but baby seemed to pause for more than 5 secs in between.  Baby BF for approx. 10 minutes and was BF but falling asleep at breast upon LC and LC student exiting.  LC returned with more donor milk. [facility] student educated FOB on proper cleaning procedure of pump pieces after seeing that the pieces were drying while still put together.   [facility] student advised mom to reach out if any help is needed.     Dale [place] 04/20/2019, 10:56 AM

## 2019-04-20 NOTE — Lactation Note (Signed)
This note was copied from a baby's chart. Lactation Consultation Note  Patient Name: Felicia Hatfield Today's Date: 04/20/2019   Prior to entering room, baby could be heard crying from the hallway.  Upon entrance, mom could be seen giving the baby the pacifier.  Mom advised LC and LC student that she had just finished pumping and was "empty" after pumping very little.  Mom stated that she offered baby the breast but he was too fussy to eat which is why she gave him the pacifier, even though she stated knowing that it should not be offered until he is "6 weeks."  Mom advised that baby had last taken donor milk about an hour before and believed FOB gave him 12 ml.  LC and LC student retrieved more donor milk and placed 13 ml in a bottle with a yellow nipple, as that is what baby had previously used.  LC student observed milk spilling out of the sides of babies mouth. LC advised that a slower flow nipple might be better.   Once baby was fed and satisfied, LC student discussed the concern with using a pacifier to soothe baby and suggested that mom try to feed the baby when he is very fussy to help bring down baby's bili levels.    Mom indicated that she has been pumping every 2-3 hours and is concerned that her body isn't working fast enough.  Mom stated that she has gotten very little sleep after a long labor. [facility] student gave mom encouragement to continue to pump as this sends a message to mom's body that milk is needed. LC advised mom to call if she needs help and/or with her next feeding at the breast.    Dale [place] 04/20/2019, 8:58 AM

## 2019-04-20 NOTE — Lactation Note (Signed)
This note was copied from a baby's chart. Lactation Consultation Note  Patient Name: Felicia Hatfield [id number] Date: 04/20/2019   I concur with note written by Charm Barges, Lactation student. I was present during consult.   I spoke with Doristine Johns, RN about procuring more DBM for this infant. [name] [facility] 04/20/2019, 9:10 AM

## 2019-04-20 NOTE — Lactation Note (Signed)
This note was copied from a baby's chart. Lactation Consultation Note  Patient Name: Felicia Hatfield [id number] Date: 04/20/2019   I concur with [name]'s note. When infant is sucking, there are suck: swallows with a ratio of 1:1. However, infant has long pauses between these sucking bursts. More DBM has been requested so that Mom can supplement after feedings at the breast.   [name] [facility] 04/20/2019, 11:14 AM

## 2019-04-20 NOTE — Lactation Note (Signed)
This note was copied from a baby's chart. Lactation Consultation Note Baby 10 hrs old. Baby on DPT. RN informed LC that mom was just giving Donor milk that she was throwing away her colostrum because it was clear and didn't think it was as good as the Donor milk. RN encouraged mom not to throw away her colostrum to give that first before Donor milk. LC asked mom how BF was going. Mom stated that she has been pumping all day every 3 hrs and has only BF once and not long before LC entered rm. Baby BF for 30 minutes now her nipple is hurting. Mom stated she likes pumping better because baby is biting and hurting.  Mom has soft compressible short shaft nipples that evert well w/stimulation. Noted small bruise to center of Lt. Nipple. Baby sleeping in bed, then FOB giving some Donor milk w/baby laying down. Discourage feeding baby that way. LC sat baby upright while FOB gave Donor milk.  Mom stated her breast are getting hard. LC assessed, felt soft, noted some slight filling to Rt. Breast to outer aspect of breast. Encouraged mom to pump because she stated it was getting uncomfortable.  Mom pumped some colostrum. Reminded mom to give to baby first before Donor milk then give the Donor milk. Mom stated the Dr. Catalina Pizza her to give the whole bottle of Donor milk for each supplement after BF. LC stated yes that's correct but give mom's colostrum or milk first, until mom's milk fully comes in and Dr. Aundria Rud her she can stop supplementing. Mom stated she understood. Praised mom for her hard work at pumping. Coconut oil given to mom. Encouraged to apply before pumping.  Patient Name: Felicia Hatfield [id number] Date: 04/20/2019 Reason for consult: Follow-up assessment;Primapara;Term   Maternal Data    Feeding Feeding Type: Breast Fed  LATCH Score       Type of Nipple: Everted at rest and after stimulation(short shaft at rest/everts well w/stimulation)  Comfort (Breast/Nipple): Filling, red/small  blisters or bruises, mild/mod discomfort(Lt. nipple sore)        Interventions Interventions: DEBP;Coconut oil;Breast compression;Breast massage;Hand express  Lactation Tools Discussed/Used Tools: Pump Breast pump type: Double-Electric Breast Pump   Consult Status Consult Status: Follow-up Date: 04/20/19 Follow-up type: In-patient    Kyeisha Janowicz, Diamond Nickel 04/20/2019, 2:55 AM

## 2019-04-21 ENCOUNTER — Ambulatory Visit: Payer: Self-pay

## 2019-04-21 NOTE — Lactation Note (Signed)
This note was copied from a baby's chart. Lactation Consultation Note  Patient Name: Boy Anie Juniel AYTKZ'S Date: 04/21/2019 Reason for consult: Follow-up assessment;Hyperbilirubinemia;Term Baby is 60 days old/5% weight loss.  Baby continues to receive phototherapy.  Mom reports that baby is latching well.  She continues to supplement with donor milk.  Mom states breasts are feeling fuller but she did not pump during the night.  Instructed to post pump today every 3 hours.  Mom will supplement with her expressed milk followed by donor milk if needed.  No questions or concerns.  Encouraged to call for assist prn.  Maternal Data    Feeding    LATCH Score                   Interventions    Lactation Tools Discussed/Used     Consult Status Consult Status: Follow-up Date: 04/22/19 Follow-up type: In-patient    Huston Foley 04/21/2019, 9:15 AM

## 2019-04-23 ENCOUNTER — Inpatient Hospital Stay (HOSPITAL_COMMUNITY)
Admission: AD | Admit: 2019-04-23 | Discharge: 2019-04-23 | Disposition: A | Discharge status: Other Acute Inpatient Hospital | Payer: BC Managed Care – PPO | Attending: Obstetrics and Gynecology | Admitting: Obstetrics and Gynecology

## 2019-05-18 ENCOUNTER — Ambulatory Visit: Payer: 59 | Admitting: Internal Medicine

## 2019-06-02 ENCOUNTER — Ambulatory Visit: Payer: BC Managed Care – PPO | Admitting: Cardiovascular Disease

## 2019-06-03 ENCOUNTER — Telehealth: Payer: Self-pay

## 2019-06-03 NOTE — Telephone Encounter (Signed)
I called patient and left her a v/m to call the office it has been a while since she has been in the office so we need to schedule her for a physical. YL,RMA

## 2019-06-15 ENCOUNTER — Encounter: Payer: Self-pay | Admitting: Cardiovascular Disease

## 2019-08-04 ENCOUNTER — Other Ambulatory Visit: Payer: Self-pay | Admitting: Obstetrics and Gynecology

## 2020-01-24 ENCOUNTER — Ambulatory Visit
Admission: EM | Admit: 2020-01-24 | Discharge: 2020-01-24 | Disposition: A | Discharge status: Home / Self Care | Payer: BC Managed Care – PPO

## 2020-01-24 DIAGNOSIS — R519 Headache, unspecified: Secondary | ICD-10-CM

## 2020-01-24 NOTE — Discharge Instructions (Signed)
Follow up with your primary care provider if your symptoms are not improving.     

## 2020-01-24 NOTE — ED Triage Notes (Signed)
Pt reports having a frontal headache x2 weeks. Also reports having light sensitivity. sts the headache gets worse when HR increase.

## 2020-01-24 NOTE — ED Provider Notes (Signed)
[name]    CSN: [id number] Arrival date & time: 01/24/20  1337      History   Chief Complaint Chief Complaint  Patient presents with  . Headache    HPI Felicia Hatfield is a 29 y.o. female.   Patient presents with right frontal headache intermittently x2 to 3 weeks with associated photophobia and nausea.  No headache currently or other symptoms.  Treatment attempted at home with ibuprofen.  She denies focal weakness, numbness, fever, chills, ear pain, sore throat, chest pain, cough, shortness of breath, vomiting, diarrhea, or other symptoms.  Her medical history includes irritable bowel syndrome, pyelonephritis, sepsis, HSV.     Past Medical History:  Diagnosis Date  . HSV (herpes simplex virus) anogenital infection   . IBS (irritable bowel syndrome)   . 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 Vacuum-assisted vaginal delivery 04/19/2019  . Vaginal delivery 04/17/2019  . Normal labor 04/16/2019  . Low blood pressure 02/23/2019  . Fever   . Leukocytosis 05/09/2014  . Nausea and vomiting 05/09/2014  . Pyelonephritis 05/08/2014  . Sepsis (HCC) 05/08/2014    Past Surgical History:  Procedure Laterality Date  . DILATION AND CURETTAGE OF UTERUS    . DILATION AND EVACUATION N/A 02/25/2018   Procedure: SUCTION DILATATION AND EVACUATION WITH ULTRASOUND GUIDANCE;  Surgeon: Jaymes Graff, MD;  Location: [facility];  Service: Gynecology;  Laterality: N/A;    OB History    Gravida  2   Para  1   Term  1   Preterm      AB  1   Living  1     SAB  0   TAB      Ectopic      Multiple  0   Live Births  1            Home Medications    Prior to Admission medications   Medication Sig Start Date End Date Taking? Authorizing Provider  acetaminophen (TYLENOL) 500 MG tablet Take 2 tablets (1,000 mg total) by mouth every 6 (six) hours as needed for fever. 04/19/19    Roma Schanz, CNM  ferrous sulfate 325 (65 FE) MG tablet Take 325 mg by mouth daily with breakfast.    [provider]  ibuprofen (ADVIL) 600 MG tablet Take 1 tablet (600 mg total) by mouth every 6 (six) hours. 04/19/19   Roma Schanz, CNM  Prenatal Vit-Fe Fumarate-FA (PRENATAL MULTIVITAMIN) TABS tablet Take 1 tablet by mouth daily at 12 noon.    [provider]    Family History Family History  Problem Relation Age of Onset  . Healthy Mother     Social History Social History   Tobacco Use  . Smoking status: Never Smoker  . Smokeless tobacco: Never Used  Vaping Use  . Vaping Use: Never used  Substance Use Topics  . Alcohol use: Not Currently  . Drug use: No     Allergies   Patient has no known allergies.   Review of Systems Review of Systems  Constitutional: Negative for chills and fever.  HENT: Negative for ear pain and sore throat.   Eyes: Positive for photophobia. Negative for pain and visual disturbance.  Respiratory: Negative for cough and shortness of breath.   Cardiovascular: Negative for chest pain and palpitations.  Gastrointestinal: Positive for nausea. Negative for abdominal pain and vomiting.  Genitourinary: Negative for dysuria and hematuria.  Musculoskeletal: Negative for arthralgias and back pain.  Skin: Negative for color change and rash.  Neurological: Positive for headaches. Negative for dizziness, tremors, seizures, syncope, facial asymmetry, speech difficulty, weakness, light-headedness and numbness.  All other systems reviewed and are negative.    Physical Exam Triage Vital Signs ED Triage Vitals  Enc Vitals Group     BP 01/24/20 1404 106/68     Pulse Rate 01/24/20 1404 74     Resp 01/24/20 1404 18     Temp 01/24/20 1404 98.8 F (37.1 C)     Temp Source 01/24/20 1404 Oral     SpO2 01/24/20 1404 95 %     Weight 01/24/20 1405 185 lb (83.9 kg)     Height 01/24/20 1405 5\' 3"  (1.6 m)     Head Circumference --      Peak  Flow --      Pain Score 01/24/20 1405 6     Pain Loc --      Pain Edu? --      Excl. in GC? --    No data found.  Updated Vital Signs BP 106/68   Pulse 74   Temp 98.8 F (37.1 C) (Oral)   Resp 18   Ht 5\' 3"  (1.6 m)   Wt 185 lb (83.9 kg)   LMP  (LMP Unknown)   SpO2 95%   Breastfeeding Yes   BMI 32.77 kg/m   Visual Acuity Right Eye Distance:   Left Eye Distance:   Bilateral Distance:    Right Eye Near:   Left Eye Near:    Bilateral Near:     Physical Exam Vitals and nursing note reviewed.  Constitutional:      General: She is not in acute distress.    Appearance: She is well-developed. She is not ill-appearing.  HENT:     Head: Normocephalic and atraumatic.     Mouth/Throat:     Mouth: Mucous membranes are moist.  Eyes:     Extraocular Movements: Extraocular movements intact.     Conjunctiva/sclera: Conjunctivae normal.     Pupils: Pupils are equal, round, and reactive to light.  Cardiovascular:     Rate and Rhythm: Normal rate and regular rhythm.     Heart sounds: No murmur heard.   Pulmonary:     Effort: Pulmonary effort is normal. No respiratory distress.     Breath sounds: Normal breath sounds.  Abdominal:     Palpations: Abdomen is soft.     Tenderness: There is no abdominal tenderness.  Musculoskeletal:        General: Normal range of motion.     Cervical back: Neck supple.  Skin:    General: Skin is warm and dry.  Neurological:     General: No focal deficit present.     Mental Status: She is alert and oriented to person, place, and time.     Cranial Nerves: No cranial nerve deficit.     Sensory: No sensory deficit.     Motor: No weakness.     Coordination: Romberg sign negative. Coordination normal.     Gait: Gait normal.  Psychiatric:        Mood and Affect: Mood normal.        Behavior: Behavior normal.      UC Treatments / Results  Labs (all labs ordered are listed, but only abnormal results are displayed) Labs Reviewed - No data to  display  EKG  Radiology No results found.  Procedures Procedures (including critical care time)  Medications Ordered in UC Medications - No data to display  Initial Impression / Assessment and Plan / UC Course  I have reviewed the triage vital signs and the nursing notes.  Pertinent labs & imaging results that were available during my care of the patient were reviewed by me and considered in my medical decision making (see chart for details).   Acute headache.  Patient is currently asymptomatic; she states her headache resolved.  She is well-appearing and her exam is reassuring.  Instructed her to continue Tylenol or ibuprofen as needed and to follow-up with her PCP.  Patient agrees to plan of care.   Final Clinical Impressions(s) / UC Diagnoses   Final diagnoses:  Acute nonintractable headache, unspecified headache type     Discharge Instructions     Follow up with your primary care provider if your symptoms are not improving.       ED Prescriptions    None     I have reviewed the PDMP during this encounter.   Mickie Bail, NP 01/24/20 1446

## 2020-01-31 ENCOUNTER — Ambulatory Visit: Payer: BC Managed Care – PPO | Admitting: Internal Medicine

## 2020-02-01 ENCOUNTER — Encounter: Payer: Self-pay | Admitting: Nurse Practitioner

## 2020-02-01 ENCOUNTER — Ambulatory Visit (INDEPENDENT_AMBULATORY_CARE_PROVIDER_SITE_OTHER): Payer: BC Managed Care – PPO | Admitting: Nurse Practitioner

## 2020-02-01 ENCOUNTER — Other Ambulatory Visit: Payer: Self-pay

## 2020-02-01 VITALS — BP 116/80 | HR 87 | Temp 98.7°F | Ht 63.0 in | Wt 187.0 lb

## 2020-02-01 DIAGNOSIS — Z2821 Immunization not carried out because of patient refusal: Secondary | ICD-10-CM | POA: Diagnosis not present

## 2020-02-01 DIAGNOSIS — R002 Palpitations: Secondary | ICD-10-CM | POA: Diagnosis not present

## 2020-02-01 DIAGNOSIS — Z23 Encounter for immunization: Secondary | ICD-10-CM

## 2020-02-01 DIAGNOSIS — R41840 Attention and concentration deficit: Secondary | ICD-10-CM | POA: Diagnosis not present

## 2020-02-01 NOTE — Patient Instructions (Signed)
General Headache Without Cause A headache is pain or discomfort that is felt around the head or neck area. There are many causes and types of headaches. In some cases, the cause may not be found. Follow these instructions at home: Watch your condition for any changes. Let your doctor know about them. Take these steps to help with your condition: Managing pain      Take over-the-counter and prescription medicines only as told by your doctor.  Lie down in a dark, quiet room when you have a headache.  If told, put ice on your head and neck area: ? Put ice in a plastic bag. ? Place a towel between your skin and the bag. ? Leave the ice on for 20 minutes, 2-3 times per day.  If told, put heat on the affected area. Use the heat source that your doctor recommends, such as a moist heat pack or a heating pad. ? Place a towel between your skin and the heat source. ? Leave the heat on for 20-30 minutes. ? Remove the heat if your skin turns bright red. This is very important if you are unable to feel pain, heat, or cold. You may have a greater risk of getting burned.  Keep lights dim if bright lights bother you or make your headaches worse. Eating and drinking  Eat meals on a regular schedule.  If you drink alcohol: ? Limit how much you use to:  0-1 drink a day for women.  0-2 drinks a day for men. ? Be aware of how much alcohol is in your drink. In the [place] one drink equals one 12 oz bottle of beer (355 mL), one 5 oz glass of wine (148 mL), or one 1 oz glass of hard liquor (44 mL).  Stop drinking caffeine, or reduce how much caffeine you drink. General instructions   Keep a journal to find out if certain things bring on headaches. For example, write down: ? What you eat and drink. ? How much sleep you get. ? Any change to your diet or medicines.  Get a massage or try other ways to relax.  Limit stress.  Sit up straight. Do not tighten (tense) your muscles.  Do not use any  products that contain nicotine or tobacco. This includes cigarettes, e-cigarettes, and chewing tobacco. If you need help quitting, ask your doctor.  Exercise regularly as told by your doctor.  Get enough sleep. This often means 7-9 hours of sleep each night.  Keep all follow-up visits as told by your doctor. This is important. Contact a doctor if:  Your symptoms are not helped by medicine.  You have a headache that feels different than the other headaches.  You feel sick to your stomach (nauseous) or you throw up (vomit).  You have a fever. Get help right away if:  Your headache gets very bad quickly.  Your headache gets worse after a lot of physical activity.  You keep throwing up.  You have a stiff neck.  You have trouble seeing.  You have trouble speaking.  You have pain in the eye or ear.  Your muscles are weak or you lose muscle control.  You lose your balance or have trouble walking.  You feel like you will pass out (faint) or you pass out.  You are mixed up (confused).  You have a seizure. Summary  A headache is pain or discomfort that is felt around the head or neck area.  There are many causes and   types of headaches. In some cases, the cause may not be found.  Keep a journal to help find out what causes your headaches. Watch your condition for any changes. Let your doctor know about them.  Contact a doctor if you have a headache that is different from usual, or if your headache is not helped by medicine.  Get help right away if your headache gets very bad, you throw up, you have trouble seeing, you lose your balance, or you have a seizure. This information is not intended to replace advice given to you by your health care provider. Make sure you discuss any questions you have with your health care provider. Document Revised: 09/15/2017 Document Reviewed: 09/15/2017 Elsevier Patient Education  2020 [employer] Inc[employer]  

## 2020-02-01 NOTE — Progress Notes (Signed)
I,Yamilka Roman [employer] as a Education administrator for [employer], FNP.,have documented all relevant documentation on the behalf of Minette Brine, FNP,as directed by  Minette Brine, FNP while in the presence of Minette Brine, [place]. This visit occurred during the SARS-CoV-2 public health emergency.  Safety protocols were in place, including screening questions prior to the visit, additional usage of staff PPE, and extensive cleaning of exam room while observing appropriate contact time as indicated for disinfecting solutions.  Subjective:     [name] , female    DOB: [birth year] , 29 y.o.   MRN: [id number]   Chief Complaint  Patient presents with  . Migraine    patient stated whenever she has palpitations and the light would be bother her eyes  . Referral    she would like a referral for ADD (she stated when she was a child she has trouble in school)    HPI  She is breast feeding and will drink water after breast feeding about 16 oz.  Denies drinking caffeine. The last time she had the palpitations. After her heart will race she will have a headache. She was seen at the urgent care, did not have an EKG.    She took one 600 mg ibuprofen which was not effective because the headache.  She has been avoiding things that will increase her heart rate. No current headache. The headaches will last for at least 4 hours.   Headache  Pertinent negatives include no coughing, dizziness or fever.  Palpitations  This is a new problem. The current episode started more than 1 month ago. The problem occurs intermittently. The problem has been unchanged. Nothing aggravates the symptoms. Associated symptoms include anxiety. Pertinent negatives include no chest pain, coughing, dizziness or fever. She has tried nothing for the symptoms. Risk factors include obesity and sedentary lifestyle.     Past Medical History:  Diagnosis Date  . HSV (herpes simplex virus) anogenital infection   . IBS (irritable  bowel syndrome)   . 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 Healthy Mother      Current Outpatient Medications:  .  acetaminophen (TYLENOL) 500 MG tablet, Take 2 tablets (1,000 mg total) by mouth every 6 (six) hours as needed for fever., Disp: 30 tablet, Rfl: 0 .  ferrous sulfate 325 (65 FE) MG tablet, Take 325 mg by mouth daily with breakfast., Disp: , Rfl:  .  ibuprofen (ADVIL) 600 MG tablet, Take 1 tablet (600 mg total) by mouth every 6 (six) hours., Disp: 30 tablet, Rfl: 0 .  Prenatal Vit-Fe Fumarate-FA (PRENATAL MULTIVITAMIN) TABS tablet, Take 1 tablet by mouth daily at 12 noon., Disp: , Rfl:    No Known Allergies   Review of Systems  Constitutional: Negative for fever.  Respiratory: Negative for cough.   Cardiovascular: Positive for palpitations. Negative for chest pain.  Neurological: Positive for headaches. Negative for dizziness.  Psychiatric/Behavioral: The patient is nervous/anxious.      Today's Vitals   02/01/20 1240  BP: 116/80  Pulse: 87  Temp: 98.7 F (37.1 C)  TempSrc: Oral  Weight: 187 lb (84.8 kg)  Height: _0  (1.6 m)  PainSc: 0-No pain   Body mass index is 33.13 kg/m.   Objective:  Physical Exam Vitals reviewed.  Constitutional:      General: She is not in acute distress.    Appearance: Normal appearance.  Cardiovascular:     Rate and Rhythm: Normal rate and regular rhythm.     Pulses: Normal pulses.     Heart sounds: Normal heart sounds. No murmur heard.   Pulmonary:     Effort: Pulmonary effort is normal. No respiratory distress.     Breath sounds: Normal breath sounds.  Neurological:     General: No focal deficit present.     Mental Status: She is alert and oriented to person, place, and time.     Cranial Nerves: No cranial nerve deficit.  Psychiatric:        Mood and Affect: Mood normal.        Behavior: Behavior normal.        Thought Content: Thought  content normal.        Judgment: Judgment normal.         Assessment And Plan:     1. Palpitations  No abnormal findings on physical exam  EKG done with nonspecific t abnormality and HR 66  Encouraged to avoid caffeine, stay well hydrated with water  Will check for metabolic causes  If symptoms worsen to return to office.  2. Concentration deficit  Reports this as worsening and reports having an issue as a child  Will refer to psychology for further evaluation - CBC - TSH - BMP8+eGFR - Ambulatory referral to Psychology  3. Need for influenza vaccination  Influenza vaccine administered  Encouraged to take Tylenol as needed for fever or muscle aches. - Flu Vaccine QUAD 6+ mos PF IM (Fluarix Quad PF)  4. COVID-19 vaccination declined  She is currently breast feeding  Declines covid 19 vaccine. Discussed risk of covid 32 and if she changes her mind about the vaccine to call the office.  Encouraged to take multivitamin, vitamin d, vitamin c and zinc to increase immune system. Aware can call office if would like to have vaccine here at office.    Patient was given opportunity to ask questions. Patient verbalized understanding of the plan and was able to repeat key elements of the plan. All questions were answered to their satisfaction.    Teola Bradley, FNP, have reviewed all documentation for this visit. The documentation on 02/06/20 for the exam, diagnosis, procedures, and orders are all accurate and complete.  THE PATIENT IS ENCOURAGED TO PRACTICE SOCIAL DISTANCING DUE TO THE COVID-19 PANDEMIC.

## 2020-02-02 LAB — BMP8+EGFR
BUN/Creatinine Ratio: 16 (ref 9–23)
BUN: 9 mg/dL (ref 6–20)
CO2: 22 mmol/L (ref 20–29)
Calcium: 9.1 mg/dL (ref 8.7–10.2)
Chloride: 105 mmol/L (ref 96–106)
Creatinine, Ser: 0.56 mg/dL — ABNORMAL LOW (ref 0.57–1.00)
GFR calc Af Amer: 148 mL/min/{1.73_m2} (ref >59)
GFR calc non Af Amer: 128 mL/min/{1.73_m2} (ref >59)
Glucose: 78 mg/dL (ref 65–99)
Potassium: 4 mmol/L (ref 3.5–5.2)
Sodium: 142 mmol/L (ref 134–144)

## 2020-02-02 LAB — CBC
Hematocrit: 39.3 % (ref 34.0–46.6)
Hemoglobin: 13.2 g/dL (ref 11.1–15.9)
MCH: 31.4 pg (ref 26.6–33.0)
MCHC: 33.6 g/dL (ref 31.5–35.7)
MCV: 93 fL (ref 79–97)
Platelets: 366 10*3/uL (ref 150–450)
RBC: 4.21 x10E6/uL (ref 3.77–5.28)
RDW: 12 % (ref 11.7–15.4)
WBC: 5.8 10*3/uL (ref 3.4–10.8)

## 2020-02-02 LAB — TSH: TSH: 1.26 u[IU]/mL (ref 0.450–4.500)

## 2020-02-06 DIAGNOSIS — R41840 Attention and concentration deficit: Secondary | ICD-10-CM | POA: Insufficient documentation

## 2020-02-06 DIAGNOSIS — R002 Palpitations: Secondary | ICD-10-CM | POA: Insufficient documentation

## 2020-02-09 NOTE — Addendum Note (Signed)
Addended by: Nelda Bucks on: 02/09/2020 04:44 PM   Modules accepted: Orders

## 2020-02-16 LAB — HM PAP SMEAR

## 2020-02-21 ENCOUNTER — Encounter: Payer: Self-pay | Admitting: Nurse Practitioner

## 2020-05-23 ENCOUNTER — Emergency Department (HOSPITAL_COMMUNITY)
Admission: EM | Admit: 2020-05-23 | Discharge: 2020-05-23 | Disposition: A | Discharge status: Home / Self Care | Payer: Managed Care, Other (non HMO) | Attending: Emergency Medicine | Admitting: Emergency Medicine

## 2020-05-23 ENCOUNTER — Other Ambulatory Visit: Payer: Self-pay

## 2020-05-23 ENCOUNTER — Telehealth: Payer: Self-pay

## 2020-05-23 ENCOUNTER — Encounter (HOSPITAL_COMMUNITY): Payer: Self-pay | Admitting: Emergency Medicine

## 2020-05-23 DIAGNOSIS — R197 Diarrhea, unspecified: Secondary | ICD-10-CM | POA: Diagnosis not present

## 2020-05-23 DIAGNOSIS — R5383 Other fatigue: Secondary | ICD-10-CM | POA: Insufficient documentation

## 2020-05-23 DIAGNOSIS — R112 Nausea with vomiting, unspecified: Secondary | ICD-10-CM | POA: Insufficient documentation

## 2020-05-23 DIAGNOSIS — R509 Fever, unspecified: Secondary | ICD-10-CM | POA: Insufficient documentation

## 2020-05-23 DIAGNOSIS — R1084 Generalized abdominal pain: Secondary | ICD-10-CM | POA: Insufficient documentation

## 2020-05-23 LAB — URINALYSIS, ROUTINE W REFLEX MICROSCOPIC
Bacteria, UA: NONE SEEN
Bilirubin Urine: NEGATIVE
Glucose, UA: NEGATIVE mg/dL
Hgb urine dipstick: NEGATIVE
Ketones, ur: 80 mg/dL — AB
Leukocytes,Ua: NEGATIVE
Nitrite: NEGATIVE
Protein, ur: NEGATIVE mg/dL
Specific Gravity, Urine: 1.031 — ABNORMAL HIGH (ref 1.005–1.030)
pH: 5 (ref 5.0–8.0)

## 2020-05-23 LAB — COMPREHENSIVE METABOLIC PANEL
ALT: 16 U/L (ref 0–44)
AST: 22 U/L (ref 15–41)
Albumin: 4.1 g/dL (ref 3.5–5.0)
Alkaline Phosphatase: 47 U/L (ref 38–126)
Anion gap: 7 (ref 5–15)
BUN: 15 mg/dL (ref 6–20)
CO2: 23 mmol/L (ref 22–32)
Calcium: 8.5 mg/dL — ABNORMAL LOW (ref 8.9–10.3)
Chloride: 108 mmol/L (ref 98–111)
Creatinine, Ser: 0.59 mg/dL (ref 0.44–1.00)
GFR, Estimated: >60 mL/min (ref >60)
Glucose, Bld: 105 mg/dL — ABNORMAL HIGH (ref 70–99)
Potassium: 3.4 mmol/L — ABNORMAL LOW (ref 3.5–5.1)
Sodium: 138 mmol/L (ref 135–145)
Total Bilirubin: 0.9 mg/dL (ref 0.3–1.2)
Total Protein: 7.5 g/dL (ref 6.5–8.1)

## 2020-05-23 LAB — CBC
HCT: 40.5 % (ref 36.0–46.0)
Hemoglobin: 13.5 g/dL (ref 12.0–15.0)
MCH: 31.3 pg (ref 26.0–34.0)
MCHC: 33.3 g/dL (ref 30.0–36.0)
MCV: 94 fL (ref 80.0–100.0)
Platelets: 284 10*3/uL (ref 150–400)
RBC: 4.31 MIL/uL (ref 3.87–5.11)
RDW: 12.1 % (ref 11.5–15.5)
WBC: 6.1 10*3/uL (ref 4.0–10.5)
nRBC: 0 % (ref 0.0–0.2)

## 2020-05-23 LAB — LIPASE, BLOOD: Lipase: 26 U/L (ref 11–51)

## 2020-05-23 LAB — I-STAT BETA HCG BLOOD, ED (MC, WL, AP ONLY): I-stat hCG, quantitative: <5 m[IU]/mL (ref <5)

## 2020-05-23 MED ORDER — ONDANSETRON HCL 4 MG/2ML IJ SOLN
4.0000 mg | Freq: Once | INTRAMUSCULAR | Status: AC
  Administered 2020-05-23: 4 mg via INTRAVENOUS
  Filled 2020-05-23: qty 2

## 2020-05-23 MED ORDER — ONDANSETRON 4 MG PO TBDP: 4.0000 mg | ORAL_TABLET | Freq: Three times a day (TID) | ORAL | 0 refills | Status: AC | PRN

## 2020-05-23 MED ORDER — SODIUM CHLORIDE 0.9 % IV BOLUS
1000.0000 mL | Freq: Once | INTRAVENOUS | Status: AC
  Administered 2020-05-23: 1000 mL via INTRAVENOUS

## 2020-05-23 NOTE — Discharge Instructions (Addendum)
You came to the emergency department today to be evaluated for your nausea, vomiting, diarrhea, and abdominal pain.  Your physical exam and lab work were reassuring.  Your symptoms are likely due to a viral GI infection and should improve over time.  I have given you a prescription for Zofran you may take this medication 8 hours as needed for nausea and vomiting.    Please take Ibuprofen (Advil, motrin) and Tylenol (acetaminophen) to relieve your pain.    You may take up to 600 MG (3 pills) of normal strength ibuprofen every 8 hours as needed.   You make take tylenol, up to 1,000 mg (two extra strength pills) every 8 hours as needed.   It is safe to take ibuprofen and tylenol at the same time as they work differently.   Do not take more than 3,000 mg tylenol in a 24 hour period (not more than one dose every 8 hours.  Please check all medication labels as many medications such as pain and cold medications may contain tylenol.  Do not drink alcohol while taking these medications.  Do not take other NSAID'S while taking ibuprofen (such as aleve or naproxen).  Please take ibuprofen with food to decrease stomach upset.  If your symptoms do not improve in the next 5 to 7 days please follow-up with your primary care provider.  Get help right away if you: Have chest pain. Feel extremely weak or you faint. See blood in your vomit. Have vomit that looks like coffee grounds. Have bloody or black stools or stools that look like tar. Have a severe headache, a stiff neck, or both. Have a rash. Have severe pain, cramping, or bloating in your abdomen. Have trouble breathing or you are breathing very quickly. Have a fast heartbeat. Have skin that feels cold and clammy. Feel confused. Have pain when you urinate. Have signs of dehydration, such as: Dark urine, very little urine, or no urine. Cracked lips. Dry mouth. Sunken eyes. Sleepiness. Weakness.

## 2020-05-23 NOTE — ED Triage Notes (Signed)
C/o nausea, vomiting, diarrhea, and generalized abd pain since yesterday.  Pain worse after eating.

## 2020-05-23 NOTE — Telephone Encounter (Signed)
I returned the pt's call and notified her that she needed to have an ER evaluation because the pt left a message that she had threw up blood this morning and that it also had bile in the vomit.

## 2020-05-23 NOTE — ED Provider Notes (Signed)
[facility] EMERGENCY DEPARTMENT Provider Note   CSN: [id number] Arrival date & time: 05/23/20  1041     History Chief Complaint  Patient presents with  . Abdominal Pain  . Vomiting    Felicia Hatfield is a 30 y.o. female with a history of irritable bowel syndrome.  Patient presents with chief complaint of nausea, vomiting, diarrhea, and generalized abdominal pain.  Patient reports that her symptoms began yesterday evening around 1800 hrs.  Onset of patient's abdominal pain was gradual.  Pain has progressively worsened.  Describes her pain as a cramping sensation.  Patient reports her pain is worse with any p.o. intake.  Due to this patient reports decreased oral intake.  Patient reports improvement in her symptoms with heating pad.  Patient states that she has vomited 4 times in the last 24 hours.  Patient's first episode of vomiting was described as violent retching that lasted approximately 10 minutes.  Patient states his first episode of emesis was food contents.  Patient reports this morning emesis was bilious with streaks of blood in it.    Patient reports watery diarrhea.  Patient denies any blood in her stool or melena.  His last bowel movement occurred earlier this morning.  Patient endorses fatigue fever with T-max of 100.16F temporal in the last 24 hours.  Patient reports that she had minimal vaginal bleeding yesterday which she attributes to her menstrual cycle as has been 4 weeks since her previous cycle.  G2 P1-0-1-1.    Patient denies any nasal congestion, rhinorrhea, sore throat, shortness of breath, cough, chest pain, dysuria, hematuria, urinary frequency, vaginal discharge, vaginal pain, vaginal bleeding.      HPI     Past Medical History:  Diagnosis Date  . HSV (herpes simplex virus) anogenital infection   . IBS (irritable bowel syndrome)   . 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 Palpitations 02/06/2020  . Concentration deficit 02/06/2020  . Vacuum-assisted vaginal delivery 04/19/2019  . Vaginal delivery 04/17/2019  . Normal labor 04/16/2019  . Low blood pressure 02/23/2019  . Fever   . Leukocytosis 05/09/2014  . Nausea and vomiting 05/09/2014  . Pyelonephritis 05/08/2014  . Sepsis ([place]) 05/08/2014    Past Surgical History:  Procedure Laterality Date  . DILATION AND CURETTAGE OF UTERUS    . DILATION AND EVACUATION N/A 02/25/2018   Procedure: SUCTION DILATATION AND EVACUATION WITH ULTRASOUND GUIDANCE;  Surgeon: Crawford Givens, MD;  Location: [facility];  Service: Gynecology;  Laterality: N/A;     OB History    Gravida  2   Para  1   Term  1   Preterm      AB  1   Living  1     SAB  0   IAB      Ectopic      Multiple  0   Live Births  1           Family History  Problem Relation Age of Onset  . Healthy Mother     Social History   Tobacco Use  . Smoking status: Never Smoker  . Smokeless tobacco: Never Used  Vaping Use  . Vaping Use: Never used  Substance Use Topics  . Alcohol use: Not Currently  . Drug use: No    Home Medications Prior to Admission medications   Medication Sig Start Date End Date  Taking? Authorizing Provider  ondansetron (ZOFRAN ODT) 4 MG disintegrating tablet Take 1 tablet (4 mg total) by mouth every 8 (eight) hours as needed for nausea or vomiting. 05/23/20  Yes Loni Beckwith, PA-C  acetaminophen (TYLENOL) 500 MG tablet Take 2 tablets (1,000 mg total) by mouth every 6 (six) hours as needed for fever. 04/19/19   Arrie Eastern, CNM  ferrous sulfate 325 (65 FE) MG tablet Take 325 mg by mouth daily with breakfast.    [provider]  ibuprofen (ADVIL) 600 MG tablet Take 1 tablet (600 mg total) by mouth every 6 (six) hours. 04/19/19   Arrie Eastern, CNM  Prenatal Vit-Fe Fumarate-FA (PRENATAL MULTIVITAMIN) TABS tablet Take 1 tablet by mouth daily at 12 noon.    [provider]    Allergies    Patient has no known allergies.  Review of Systems   Review of Systems  Constitutional: Positive for fatigue and fever. Negative for chills.  HENT: Negative for congestion, rhinorrhea and sore throat.   Eyes: Negative for visual disturbance.  Respiratory: Negative for cough and shortness of breath.   Cardiovascular: Negative for chest pain.  Gastrointestinal: Positive for abdominal pain and diarrhea. Negative for abdominal distention, anal bleeding, blood in stool, constipation, nausea, rectal pain and vomiting.  Genitourinary: Negative for difficulty urinating, dysuria, frequency, genital sores, hematuria, pelvic pain, vaginal bleeding, vaginal discharge and vaginal pain.  Musculoskeletal: Negative for back pain and neck pain.  Skin: Negative for color change and rash.  Neurological: Negative for dizziness, syncope, light-headedness and headaches.  Psychiatric/Behavioral: Negative for confusion.    Physical Exam Updated Vital Signs BP 107/73 (BP Location: Left Arm)   Pulse 99   Temp 99.6 F (37.6 C)   Resp 16   LMP 04/27/2020   SpO2 100%   Breastfeeding Yes   Physical Exam Vitals and nursing note reviewed.  Constitutional:      General: She is not in acute distress.    Appearance: She is not ill-appearing, toxic-appearing or diaphoretic.  HENT:     Head: Normocephalic.  Eyes:     General: No scleral icterus.       Right eye: No discharge.        Left eye: No discharge.  Cardiovascular:     Rate and Rhythm: Normal rate.     Heart sounds: Normal heart sounds.  Pulmonary:     Effort: Pulmonary effort is normal. No tachypnea, bradypnea or respiratory distress.     Breath sounds: No stridor.  Abdominal:     General: Bowel sounds are normal. There is no distension.     Palpations: Abdomen is soft. There is no mass or pulsatile mass.     Tenderness: There is generalized abdominal tenderness. There is no right CVA tenderness, left CVA  tenderness, guarding or rebound. Negative signs include McBurney's sign and psoas sign.     Hernia: There is no hernia in the umbilical area or ventral area.     Comments: Mild tenderness throughout abdomen  Musculoskeletal:     Cervical back: Neck supple.     Right lower leg: No swelling, deformity, tenderness or bony tenderness. No edema.     Left lower leg: No swelling, deformity, tenderness or bony tenderness. No edema.  Skin:    General: Skin is warm and dry.     Coloration: Skin is not jaundiced or pale.  Neurological:     General: No focal deficit present.     Mental Status: She  is alert.  Psychiatric:        Behavior: Behavior is cooperative.     ED Results / Procedures / Treatments   Labs (all labs ordered are listed, but only abnormal results are displayed) Labs Reviewed  COMPREHENSIVE METABOLIC PANEL - Abnormal; Notable for the following components:      Result Value   Potassium 3.4 (*)    Glucose, Bld 105 (*)    Calcium 8.5 (*)    All other components within normal limits  URINALYSIS, ROUTINE W REFLEX MICROSCOPIC - Abnormal; Notable for the following components:   APPearance HAZY (*)    Specific Gravity, Urine 1.031 (*)    Ketones, ur 80 (*)    Non Squamous Epithelial 0-5 (*)    All other components within normal limits  LIPASE, BLOOD  CBC  I-STAT BETA HCG BLOOD, ED ([facility], [facility], [facility] ONLY)    EKG None  Radiology No results found.  Procedures Procedures   Medications Ordered in ED Medications  sodium chloride 0.9 % bolus 1,000 mL (0 mLs Intravenous Stopped 05/23/20 1337)  ondansetron (ZOFRAN) injection 4 mg (4 mg Intravenous Given 05/23/20 1200)    ED Course  I have reviewed the triage vital signs and the nursing notes.  Pertinent labs & imaging results that were available during my care of the patient were reviewed by me and considered in my medical decision making (see chart for details).  Clinical Course as of 05/23/20 1659  Tue May 23, 2020  1436  Sodium: 138 [PB]  1436 MCV: 94.0 [PB]    Clinical Course User Index [PB] Dyann Ruddle   MDM Rules/Calculators/A&P                          Alert 30 year old female no acute distress, nontoxic appearing.  Patient presents with chief complaint of nausea, vomiting, diarrhea, generalized abdominal pain.  On physical exam normoactive bowel sounds, abdomen soft, nondistended, generalized tenderness throughout entire abdomen.  I-STAT beta-hCG less than 5 low suspicion for ectopic pregnancy or intrauterine pregnancy.   Lipase within normal limits low suspicion for acute pancreatitis. CBC is unremarkable.  Patient is afebrile, no focal tenderness to lower abdominal quadrant, no focal tenderness over McBurney's point, negative psoas sign; low suspicion for acute pancreatitis. AST, ALT, alk phos, total bili all within normal limits; low suspicion for hepatobiliary disease. Urinalysis shows no signs of infection, patient denies any dysuria, urinary frequency, or hematuria, negative CVA tenderness; low suspicion for urinary tract infection or pyelonephritis.  Patient received 1 L fluid bolus, Zofran.  Shared decision-making was used with patient prior to administering Zofran due to her continued breast-feeding.  Patient is amenable with this medication.   Patient reports improvement in her nausea.  Patient has not vomited since being in the emergency department.  Patient symptoms likely due to viral gastroenteritis.  Patient given prescription for Zofran.   patient given information to follow-up with her primary care provider if symptoms do not improve.  Discussed results, findings, treatment and follow up. Patient advised of return precautions. Patient verbalized understanding and agreed with plan.   Final Clinical Impression(s) / ED Diagnoses Final diagnoses:  Nausea vomiting and diarrhea    Rx / DC Orders ED Discharge Orders         Ordered    ondansetron (ZOFRAN ODT) 4 MG  disintegrating tablet  Every 8 hours PRN        05/23/20 1535  Loni Beckwith, PA-C 05/23/20 1713    Elnora Morrison, MD 05/25/20 [phone]

## 2020-08-30 ENCOUNTER — Encounter: Payer: Self-pay | Admitting: Internal Medicine

## 2021-07-07 ENCOUNTER — Encounter: Payer: Self-pay | Admitting: Emergency Medicine

## 2021-07-07 ENCOUNTER — Ambulatory Visit
Admission: EM | Admit: 2021-07-07 | Discharge: 2021-07-07 | Disposition: A | Discharge status: Home / Self Care | Payer: Managed Care, Other (non HMO) | Attending: Family Medicine | Admitting: Family Medicine

## 2021-07-07 ENCOUNTER — Other Ambulatory Visit: Payer: Self-pay

## 2021-07-07 DIAGNOSIS — Z1152 Encounter for screening for COVID-19: Secondary | ICD-10-CM | POA: Diagnosis not present

## 2021-07-07 DIAGNOSIS — B9789 Other viral agents as the cause of diseases classified elsewhere: Secondary | ICD-10-CM

## 2021-07-07 DIAGNOSIS — B349 Viral infection, unspecified: Secondary | ICD-10-CM | POA: Diagnosis not present

## 2021-07-07 DIAGNOSIS — J028 Acute pharyngitis due to other specified organisms: Secondary | ICD-10-CM | POA: Diagnosis not present

## 2021-07-07 LAB — POCT RAPID STREP A (OFFICE): Rapid Strep A Screen: NEGATIVE

## 2021-07-07 MED ORDER — LIDOCAINE VISCOUS HCL 2 % MT SOLN: 15.0000 mL | OROMUCOSAL | 0 refills | Status: AC | PRN

## 2021-07-07 NOTE — ED Triage Notes (Signed)
Pt c/o ST since yesterday °

## 2021-07-07 NOTE — Discharge Instructions (Addendum)
COVID/Flu test pending. Symptom management warranted only.  Manage fever with Tylenol and ibuprofen.  Treatment per discharge medications/discharge instructions.  Red flags/ER precautions given. The most current CDC isolation/quarantine recommendation advised.   ?

## 2021-07-07 NOTE — ED Provider Notes (Signed)
?[facility] ? ? ? ?CSN: [id number] ?Arrival date & time: 07/07/21  7673 ? ? ?  ? ?History   ?Chief Complaint ?Chief Complaint  ?Patient presents with  ? Sore Throat  ? ? ?HPI ?Felicia Hatfield is a 31 y.o. female.  ? ?HPI ?Patient presents today with week long history of mild sore throat which has worsened over the last 24 hours and includes symptoms of generalized body aches and headache.  She reports an exposure to strep.  No known exposure to COVID.  She has a low-grade temp on arrival today of 99.9.  She has not taken any Tylenol or ibuprofen. ?Past Medical History:  ?Diagnosis Date  ? HSV (herpes simplex virus) anogenital infection   ? IBS (irritable bowel syndrome)   ? Vaginal Pap smear, abnormal   ? 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 ? ?Patient Active Problem List  ? Diagnosis Date Noted  ? Palpitations 02/06/2020  ? Concentration deficit 02/06/2020  ? Vacuum-assisted vaginal delivery 04/19/2019  ? Vaginal delivery 04/17/2019  ? Normal labor 04/16/2019  ? Low blood pressure 02/23/2019  ? Fever   ? Leukocytosis 05/09/2014  ? Nausea and vomiting 05/09/2014  ? Pyelonephritis 05/08/2014  ? Sepsis ([facility]) 05/08/2014  ? ? ?Past Surgical History:  ?Procedure Laterality Date  ? DILATION AND CURETTAGE OF UTERUS    ? DILATION AND EVACUATION N/A 02/25/2018  ? Procedure: SUCTION DILATATION AND EVACUATION WITH ULTRASOUND GUIDANCE;  Surgeon: Jaymes Graff, MD;  Location: [facility];  Service: Gynecology;  Laterality: N/A;  ? ? ?OB History   ? ? Gravida  ?2  ? Para  ?1  ? Term  ?1  ? Preterm  ?   ? AB  ?1  ? Living  ?1  ?  ? ? SAB  ?0  ? IAB  ?   ? Ectopic  ?   ? Multiple  ?0  ? Live Births  ?1  ?   ?  ?  ? ? ? ?Home Medications   ? ?Prior to Admission medications   ?Medication Sig Start Date End Date Taking? Authorizing Provider  ?lidocaine (XYLOCAINE) 2 % solution Use as directed 15 mLs in the mouth or throat every 3 (three) hours as needed for mouth pain (mix with warm water). 07/07/21  Yes Bing Neighbors, FNP   ?acetaminophen (TYLENOL) 500 MG tablet Take 2 tablets (1,000 mg total) by mouth every 6 (six) hours as needed for fever. 04/19/19   Roma Schanz, CNM  ?ferrous sulfate 325 (65 FE) MG tablet Take 325 mg by mouth daily with breakfast.    [provider]  ?ibuprofen (ADVIL) 600 MG tablet Take 1 tablet (600 mg total) by mouth every 6 (six) hours. 04/19/19   Roma Schanz, CNM  ?ondansetron (ZOFRAN ODT) 4 MG disintegrating tablet Take 1 tablet (4 mg total) by mouth every 8 (eight) hours as needed for nausea or vomiting. 05/23/20   Haskel Schroeder, PA-C  ?Prenatal Vit-Fe Fumarate-FA (PRENATAL MULTIVITAMIN) TABS tablet Take 1 tablet by mouth daily at 12 noon.    [provider]  ? ? ?Family History ?Family History  ?Problem Relation Age of Onset  ? Healthy Mother   ? ? ?Social History ?Social History  ? ?Tobacco Use  ? Smoking status: Never  ? Smokeless tobacco: Never  ?Vaping Use  ? Vaping Use: Never used  ?Substance Use Topics  ? Alcohol use: Not Currently  ? Drug use: No  ? ? ? ?Allergies   ?  Patient has no known allergies. ? ? ?Review of Systems ?Review of Systems ?Pertinent negatives listed in HPI  ? ?Physical Exam ?Triage Vital Signs ?ED Triage Vitals  ?Enc Vitals Group  ?   BP 07/07/21 0923 107/71  ?   Pulse Rate 07/07/21 0923 99  ?   Resp 07/07/21 0923 16  ?   Temp 07/07/21 0923 99.9 ?F (37.7 ?C)  ?   Temp Source 07/07/21 0923 Oral  ?   SpO2 07/07/21 0923 95 %  ?   Weight --   ?   Height --   ?   Head Circumference --   ?   Peak Flow --   ?   Pain Score 07/07/21 0926 9  ?   Pain Loc --   ?   Pain Edu? --   ?   Excl. in GC? --   ? ?No data found. ? ?Updated Vital Signs ?BP 107/71 (BP Location: Left Arm)   Pulse 99   Temp 99.9 ?F (37.7 ?C) (Oral)   Resp 16   LMP 06/08/2021   SpO2 95%  ? ?Visual Acuity ?Right Eye Distance:   ?Left Eye Distance:   ?Bilateral Distance:   ? ?Right Eye Near:   ?Left Eye Near:    ?Bilateral Near:    ? ?Physical Exam ?Constitutional:   ?   Appearance: She is  ill-appearing.  ?HENT:  ?   Head: Normocephalic and atraumatic.  ?   Right Ear: Tympanic membrane normal.  ?   Left Ear: Tympanic membrane and ear canal normal.  ?   Mouth/Throat:  ?   Pharynx: Posterior oropharyngeal erythema present. No pharyngeal swelling, oropharyngeal exudate or uvula swelling.  ?Eyes:  ?   Conjunctiva/sclera: Conjunctivae normal.  ?   Pupils: Pupils are equal, round, and reactive to light.  ?Cardiovascular:  ?   Rate and Rhythm: Normal rate and regular rhythm.  ?Pulmonary:  ?   Effort: Pulmonary effort is normal.  ?   Breath sounds: Normal breath sounds.  ?Lymphadenopathy:  ?   Cervical: Cervical adenopathy present.  ?Skin: ?   General: Skin is warm and dry.  ?   Capillary Refill: Capillary refill takes less than 2 seconds.  ?Neurological:  ?   General: No focal deficit present.  ?   Mental Status: She is alert.  ?Psychiatric:     ?   Mood and Affect: Mood normal.  ? ? ? ?UC Treatments / Results  ?Labs ?(all labs ordered are listed, but only abnormal results are displayed) ?Labs Reviewed  ?COVID-19, FLU A+B NAA  ?CULTURE, GROUP A STREP [facility])  ?POCT RAPID STREP A (OFFICE)  ? ? ?EKG ? ? ?Radiology ?No results found. ? ?Procedures ?Procedures (including critical care time) ? ?Medications Ordered in UC ?Medications - No data to display ? ?Initial Impression / Assessment and Plan / UC Course  ?I have reviewed the triage vital signs and the nursing notes. ? ?Pertinent labs & imaging results that were available during my care of the patient were reviewed by me and considered in my medical decision making (see chart for details). ? ?  ?Rapid strep negative.  Throat culture pending.  Throat appearance is not that of strep unless it is early evolving strep.  We will collect a COVID test given patient's symptoms.  COVID/flu test pending.  Suspect patient is experiencing symptoms related to a viral illness therefore symptomatic treatment is warranted only.  Lidocaine viscous prescribed for throat pain.   Advised  to alternate Tylenol and ibuprofen as needed for fever and body aches.  Hydrate well with fluids.  Rest.  Return as needed. ?Final Clinical Impressions(s) / UC Diagnoses  ? ?Final diagnoses:  ?Encounter for screening for COVID-19  ?Sore throat (viral)  ?Viral illness  ? ? ? ?Discharge Instructions   ? ?  ?COVID/Flu test pending. Symptom management warranted only.  Manage fever with Tylenol and ibuprofen.  Treatment per discharge medications/discharge instructions.  Red flags/ER precautions given. The most current CDC isolation/quarantine recommendation advised.   ? ? ? ? ?ED Prescriptions   ? ? Medication Sig Dispense Auth. Provider  ? lidocaine (XYLOCAINE) 2 % solution Use as directed 15 mLs in the mouth or throat every 3 (three) hours as needed for mouth pain (mix with warm water). 50 mL Bing NeighborsHarris, Ieasha Boerema S, FNP  ? ?  ? ?PDMP not reviewed this encounter. ?  ?Bing NeighborsHarris, Terran Hollenkamp S, FNP ?07/07/21 1118 ? ?

## 2021-07-08 ENCOUNTER — Ambulatory Visit: Payer: BC Managed Care – PPO

## 2021-07-09 LAB — COVID-19, FLU A+B NAA
Influenza A, NAA: NOT DETECTED
Influenza B, NAA: NOT DETECTED
SARS-CoV-2, NAA: NOT DETECTED

## 2021-07-10 LAB — CULTURE, GROUP A STREP (THRC)

## 2021-08-29 ENCOUNTER — Ambulatory Visit: Payer: BC Managed Care – PPO

## 2021-09-25 ENCOUNTER — Ambulatory Visit
Admission: RE | Admit: 2021-09-25 | Discharge: 2021-09-25 | Disposition: A | Discharge status: Home / Self Care | Payer: Managed Care, Other (non HMO) | Source: Ambulatory Visit | Attending: Family Medicine | Admitting: Family Medicine

## 2021-09-25 VITALS — BP 103/68 | HR 68 | Temp 97.8°F | Resp 16

## 2021-09-25 DIAGNOSIS — H66002 Acute suppurative otitis media without spontaneous rupture of ear drum, left ear: Secondary | ICD-10-CM

## 2021-09-25 MED ORDER — NEOMYCIN-POLYMYXIN-HC 3.5-10000-1 OT SUSP: 3.0000 [drp] | Freq: Three times a day (TID) | OTIC | 0 refills | Status: AC

## 2021-09-25 MED ORDER — AMOXICILLIN 875 MG PO TABS: 875.0000 mg | ORAL_TABLET | Freq: Two times a day (BID) | ORAL | 0 refills | Status: AC

## 2021-09-25 NOTE — ED Provider Notes (Signed)
[name]    CSN: [id number] Arrival date & time: 09/25/21  1759      History   Chief Complaint Chief Complaint  Patient presents with   Ear Injury    Entered by patient    HPI Felicia Hatfield is a 31 y.o. female.   HPI Patient presents today with left ear pain.  She reports left ear pain has been present days for 5 days. She has recently been swimming and is concern an ear infection has developed due to water exposure. She [place] afebrile. Denies any associated URI symptoms. 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    Patient Active Problem List   Diagnosis Date Noted   Palpitations 02/06/2020   Concentration deficit 02/06/2020   Vacuum-assisted vaginal delivery 04/19/2019   Vaginal delivery 04/17/2019   Normal labor 04/16/2019   Low blood pressure 02/23/2019   Fever    Leukocytosis 05/09/2014   Nausea and vomiting 05/09/2014   Pyelonephritis 05/08/2014   Sepsis ([facility]) 05/08/2014    Past Surgical History:  Procedure Laterality Date   DILATION AND CURETTAGE OF UTERUS     DILATION AND EVACUATION N/A 02/25/2018   Procedure: SUCTION DILATATION AND EVACUATION WITH ULTRASOUND GUIDANCE;  Surgeon: Jaymes Graff, MD;  Location: [facility];  Service: Gynecology;  Laterality: N/A;    OB History     Gravida  2   Para  1   Term  1   Preterm      AB  1   Living  1      SAB  0   IAB      Ectopic      Multiple  0   Live Births  1            Home Medications    Prior to Admission medications   Medication Sig Start Date End Date Taking? Authorizing Provider  amoxicillin (AMOXIL) 875 MG tablet Take 1 tablet (875 mg total) by mouth 2 (two) times daily. 09/25/21  Yes Bing Neighbors, FNP  neomycin-polymyxin-hydrocortisone (CORTISPORIN) 3.5-10000-1 OTIC suspension Place 3 drops into the left ear 3  (three) times daily. 09/25/21  Yes Bing Neighbors, FNP  acetaminophen (TYLENOL) 500 MG tablet Take 2 tablets (1,000 mg total) by mouth every 6 (six) hours as needed for fever. 04/19/19   Roma Schanz, CNM  ferrous sulfate 325 (65 FE) MG tablet Take 325 mg by mouth daily with breakfast.    [provider]  ibuprofen (ADVIL) 600 MG tablet Take 1 tablet (600 mg total) by mouth every 6 (six) hours. 04/19/19   Roma Schanz, CNM  lidocaine (XYLOCAINE) 2 % solution Use as directed 15 mLs in the mouth or throat every 3 (three) hours as needed for mouth pain (mix with warm water). 07/07/21   Bing Neighbors, FNP  ondansetron (ZOFRAN ODT) 4 MG disintegrating tablet Take 1 tablet (4 mg total) by mouth every 8 (eight) hours as needed for nausea or vomiting. 05/23/20   Haskel Schroeder, PA-C  Prenatal Vit-Fe Fumarate-FA (PRENATAL MULTIVITAMIN) TABS tablet Take 1 tablet by mouth daily at 12 noon.    [provider]    Family History Family History  Problem Relation Age of Onset   Healthy Mother     Social History Social History   Tobacco Use  Smoking status: Never   Smokeless tobacco: Never  Vaping Use   Vaping Use: Never used  Substance Use Topics   Alcohol use: Not Currently   Drug use: No     Allergies   Patient has no known allergies.  Review of Systems Review of Systems Pertinent negatives listed in HPI  Physical Exam Triage Vital Signs ED Triage Vitals  Enc Vitals Group     BP 09/25/21 1813 103/68     Pulse Rate 09/25/21 1813 68     Resp 09/25/21 1813 16     Temp 09/25/21 1813 97.8 F (36.6 C)     Temp Source 09/25/21 1813 Temporal     SpO2 09/25/21 1813 98 %     Weight --      Height --      Head Circumference --      Peak Flow --      Pain Score 09/25/21 1811 7     Pain Loc --      Pain Edu? --      Excl. in GC? --    No data found.  Updated Vital Signs BP 103/68 (BP Location: Left Arm)   Pulse 68   Temp 97.8 F (36.6 C) (Temporal)    Resp 16   LMP 09/07/2021   SpO2 98%   Visual Acuity Right Eye Distance:   Left Eye Distance:   Bilateral Distance:    Right Eye Near:   Left Eye Near:    Bilateral Near:     Physical Exam Vitals reviewed.  Constitutional:      Appearance: Normal appearance.  HENT:     Head: Normocephalic and atraumatic.     Right Ear: Hearing, tympanic membrane, ear canal and external ear normal.     Left Ear: Swelling and tenderness present. Tympanic membrane is erythematous and bulging.  Cardiovascular:     Rate and Rhythm: Normal rate and regular rhythm.  Musculoskeletal:     Cervical back: Full passive range of motion without pain, normal range of motion and neck supple.  Lymphadenopathy:     Cervical: No cervical adenopathy.  Skin:    General: Skin is warm.     Capillary Refill: Capillary refill takes less than 2 seconds.  Neurological:     Mental Status: She is alert.  Psychiatric:        Attention and Perception: Attention and perception normal.        Mood and Affect: Mood normal.        Speech: Speech normal.        Behavior: Behavior normal.     UC Treatments / Results  Labs (all labs ordered are listed, but only abnormal results are displayed) Labs Reviewed - No data to display  EKG   Radiology No results found.  Procedures Procedures (including critical care time)  Medications Ordered in UC Medications - No data to display  Initial Impression / Assessment and Plan / UC Course  I have reviewed the triage vital signs and the nursing notes.  Pertinent labs & imaging results that were available during my care of the patient were reviewed by me and considered in my medical decision making (see chart for details).    Polysporin TID x 5 days, PRN. Amoxicillin BID x 10 days.  Return if symptoms worsen or do not improve. Final Clinical Impressions(s) / UC Diagnoses   Final diagnoses:  Non-recurrent acute suppurative otitis media of left ear without spontaneous  rupture of tympanic membrane  Discharge Instructions   None    ED Prescriptions     Medication Sig Dispense Auth. Provider   neomycin-polymyxin-hydrocortisone (CORTISPORIN) 3.5-10000-1 OTIC suspension Place 3 drops into the left ear 3 (three) times daily. 10 mL Bing Neighbors, FNP   amoxicillin (AMOXIL) 875 MG tablet Take 1 tablet (875 mg total) by mouth 2 (two) times daily. 20 tablet Bing Neighbors, FNP      PDMP not reviewed this encounter.   Bing Neighbors, FNP 09/28/21 1040

## 2021-09-25 NOTE — ED Triage Notes (Signed)
Patient presents to Urgent Care with complaints of left ear pain since Thursday. Believes she has swimmers ear. Treating with debrox no improvement.

## 2021-10-27 ENCOUNTER — Ambulatory Visit
Admission: RE | Admit: 2021-10-27 | Discharge: 2021-10-27 | Disposition: A | Discharge status: Home / Self Care | Payer: Managed Care, Other (non HMO) | Source: Ambulatory Visit | Attending: Internal Medicine | Admitting: Internal Medicine

## 2021-10-27 VITALS — BP 104/73 | HR 67 | Temp 97.9°F | Resp 16

## 2021-10-27 DIAGNOSIS — H6982 Other specified disorders of Eustachian tube, left ear: Secondary | ICD-10-CM

## 2021-10-27 DIAGNOSIS — R6889 Other general symptoms and signs: Secondary | ICD-10-CM

## 2021-10-27 NOTE — ED Triage Notes (Signed)
Patient presents to Urgent Care for follow-up. States she was seen and treated for ear infection 07/18. Symptoms had improved until last week. Continues to have ear pain and some fluid in ear. Taking tylenol.   Denies fever.

## 2021-10-27 NOTE — ED Provider Notes (Signed)
Felicia Hatfield    CSN: [id number] Arrival date & time: 10/27/21  9798      History   Chief Complaint Chief Complaint  Patient presents with   Follow-up   Otalgia   Headache    HPI Felicia Hatfield is a 31 y.o. female.   HPI Patient seen over one month ago and diagnosed with left ear infection. She was treated with Amoxicillin and prescribed Neomycin for external ear inflammation. She is back today as she reports symptoms of ear pain resolved however, subsequently returned last week. She also endorses one day of headache pain. Reports a history of migraines, although PCP advised to manage with OTC medications given the symptoms of HA are infrequent. Had a HA yesterday which she reported as severe. Took tylenol and reports no HA pain today. She has been outside in the sun prior to the onset of HA. She wears corrective lenses however, is overdue to an eye exam. Reports headache began yesterday and continued into today.  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    Patient Active Problem List   Diagnosis Date Noted   Palpitations 02/06/2020   Concentration deficit 02/06/2020   Vacuum-assisted vaginal delivery 04/19/2019   Vaginal delivery 04/17/2019   Normal labor 04/16/2019   Low blood pressure 02/23/2019   Fever    Leukocytosis 05/09/2014   Nausea and vomiting 05/09/2014   Pyelonephritis 05/08/2014   Sepsis (HCC) 05/08/2014    Past Surgical History:  Procedure Laterality Date   DILATION AND CURETTAGE OF UTERUS     DILATION AND EVACUATION N/A 02/25/2018   Procedure: SUCTION DILATATION AND EVACUATION WITH ULTRASOUND GUIDANCE;  Surgeon: Jaymes Graff, MD;  Location: [facility];  Service: Gynecology;  Laterality: N/A;    OB History     Gravida  2   Para  1   Term  1   Preterm      AB  1   Living  1      SAB   0   IAB      Ectopic      Multiple  0   Live Births  1            Home Medications    Prior to Admission medications   Medication Sig Start Date End Date Taking? Authorizing Provider  acetaminophen (TYLENOL) 500 MG tablet Take 2 tablets (1,000 mg total) by mouth every 6 (six) hours as needed for fever. 04/19/19   Roma Schanz, CNM  amoxicillin (AMOXIL) 875 MG tablet Take 1 tablet (875 mg total) by mouth 2 (two) times daily. 09/25/21   Bing Neighbors, FNP  ferrous sulfate 325 (65 FE) MG tablet Take 325 mg by mouth daily with breakfast.    [provider]  ibuprofen (ADVIL) 600 MG tablet Take 1 tablet (600 mg total) by mouth every 6 (six) hours. 04/19/19   Roma Schanz, CNM  lidocaine (XYLOCAINE) 2 % solution Use as directed 15 mLs in the mouth or throat every 3 (three) hours as needed for mouth pain (mix with warm water). 07/07/21   Bing Neighbors, FNP  neomycin-polymyxin-hydrocortisone (CORTISPORIN) 3.5-10000-1 OTIC suspension Place 3 drops into the left ear 3 (three) times daily. 09/25/21   Bing Neighbors, FNP  ondansetron (ZOFRAN ODT) 4 MG disintegrating tablet Take 1 tablet (4  mg total) by mouth every 8 (eight) hours as needed for nausea or vomiting. 05/23/20   Haskel Schroeder, PA-C  Prenatal Vit-Fe Fumarate-FA (PRENATAL MULTIVITAMIN) TABS tablet Take 1 tablet by mouth daily at 12 noon.    [provider]    Family History Family History  Problem Relation Age of Onset   Healthy Mother     Social History Social History   Tobacco Use   Smoking status: Never   Smokeless tobacco: Never  Vaping Use   Vaping Use: Never used  Substance Use Topics   Alcohol use: Not Currently   Drug use: No     Allergies   Patient has no known allergies.   Review of Systems Review of Systems Pertinent negatives listed in HPI   Physical Exam Triage Vital Signs ED Triage Vitals [10/27/21 1022]  Enc Vitals Group     BP      Pulse      Resp       Temp      Temp src      SpO2      Weight      Height      Head Circumference      Peak Flow      Pain Score 0     Pain Loc      Pain Edu?      Excl. in GC?    No data found.  Updated Vital Signs BP 104/73 (BP Location: Left Arm)   Pulse 67   Temp 97.9 F (36.6 C) (Temporal)   Resp 16   LMP 10/02/2021   SpO2 98%   Visual Acuity Right Eye Distance:   Left Eye Distance:   Bilateral Distance:    Right Eye Near:   Left Eye Near:    Bilateral Near:     Physical Exam Vitals reviewed.  Constitutional:      Appearance: She is well-developed.  HENT:     Head: Normocephalic and atraumatic.     Right Ear: Tympanic membrane, ear canal and external ear normal.     Left Ear: Tympanic membrane, ear canal and external ear normal.     Ears:     Comments: Tenderness distal medial of left ear (consistent ET related pain)    Nose:     Comments: Congestion and rhinorrhea (left nares only)   Eyes:     Extraocular Movements: Extraocular movements intact.     Pupils: Pupils are equal, round, and reactive to light.  Cardiovascular:     Rate and Rhythm: Normal rate and regular rhythm.  Pulmonary:     Effort: Pulmonary effort is normal.     Breath sounds: Normal breath sounds.  Musculoskeletal:     Cervical back: Normal range of motion.  Skin:    General: Skin is warm and dry.     Capillary Refill: Capillary refill takes less than 2 seconds.  Neurological:     General: No focal deficit present.     Mental Status: She is alert and oriented to person, place, and time.      UC Treatments / Results  Labs (all labs ordered are listed, but only abnormal results are displayed) Labs Reviewed - No data to display  EKG   Radiology No results found.  Procedures Procedures (including critical care time)  Medications Ordered in UC Medications - No data to display  Initial Impression / Assessment and Plan / UC Course  I have reviewed the triage vital signs and  the nursing  notes.  Pertinent labs & imaging results that were available during my care of the patient were reviewed by me and considered in my medical decision making (see chart for details).    Acute Dysfunction of left ET Inner left ear exam unremarkable. Recommend follow-up with opthalmology regarding HA induced to bright light. Need updated eye exam. Final Clinical Impressions(s) / UC Diagnoses   Final diagnoses:  Acute dysfunction of Eustachian tube, left     Discharge Instructions      Resume daily antihistamine therapy with either Cetirizine or Xyzal (both over the counter). Flonase can help with eustachian tube pressure or discomfort as well. This treatment can take up to 2-3 weeks to improve symptoms. If your symptoms persists, recommend evaluation by ENT. For recurrent headaches, schedule a ophthalmology appointment. Symptoms are concerning for possible occular migraine      ED Prescriptions   None    PDMP not reviewed this encounter.   Bing Neighbors, FNP 10/27/21 1050

## 2021-10-27 NOTE — Discharge Instructions (Signed)
Resume daily antihistamine therapy with either Cetirizine or Xyzal (both over the counter). Flonase can help with eustachian tube pressure or discomfort as well. This treatment can take up to 2-3 weeks to improve symptoms. If your symptoms persists, recommend evaluation by ENT. For recurrent headaches, schedule a ophthalmology appointment. Symptoms are concerning for possible occular migraine

## 2022-08-10 ENCOUNTER — Emergency Department: Payer: Managed Care, Other (non HMO)

## 2022-08-10 ENCOUNTER — Emergency Department
Admission: EM | Admit: 2022-08-10 | Discharge: 2022-08-10 | Disposition: A | Discharge status: Home / Self Care | Payer: Managed Care, Other (non HMO) | Attending: Emergency Medicine | Admitting: Emergency Medicine

## 2022-08-10 ENCOUNTER — Encounter: Payer: Self-pay | Admitting: Emergency Medicine

## 2022-08-10 DIAGNOSIS — O209 Hemorrhage in early pregnancy, unspecified: Secondary | ICD-10-CM | POA: Insufficient documentation

## 2022-08-10 DIAGNOSIS — O4690 Antepartum hemorrhage, unspecified, unspecified trimester: Secondary | ICD-10-CM

## 2022-08-10 LAB — ABO/RH: ABO/RH(D): O POS

## 2022-08-10 LAB — CBC
HCT: 34.6 % — ABNORMAL LOW (ref 36.0–46.0)
Hemoglobin: 11.6 g/dL — ABNORMAL LOW (ref 12.0–15.0)
MCH: 31.3 pg (ref 26.0–34.0)
MCHC: 33.5 g/dL (ref 30.0–36.0)
MCV: 93.3 fL (ref 80.0–100.0)
Platelets: 298 10*3/uL (ref 150–400)
RBC: 3.71 MIL/uL — ABNORMAL LOW (ref 3.87–5.11)
RDW: 12.4 % (ref 11.5–15.5)
WBC: 6.8 10*3/uL (ref 4.0–10.5)
nRBC: 0 % (ref 0.0–0.2)

## 2022-08-10 LAB — BASIC METABOLIC PANEL
Anion gap: 5 (ref 5–15)
BUN: 11 mg/dL (ref 6–20)
CO2: 24 mmol/L (ref 22–32)
Calcium: 8.5 mg/dL — ABNORMAL LOW (ref 8.9–10.3)
Chloride: 107 mmol/L (ref 98–111)
Creatinine, Ser: 0.55 mg/dL (ref 0.44–1.00)
GFR, Estimated: >60 mL/min (ref >60)
Glucose, Bld: 93 mg/dL (ref 70–99)
Potassium: 3.5 mmol/L (ref 3.5–5.1)
Sodium: 136 mmol/L (ref 135–145)

## 2022-08-10 LAB — HCG, QUANTITATIVE, PREGNANCY: hCG, Beta Chain, Quant, S: 248 m[IU]/mL — ABNORMAL HIGH (ref <5)

## 2022-08-10 LAB — URINALYSIS, ROUTINE W REFLEX MICROSCOPIC
Bilirubin Urine: NEGATIVE
Glucose, UA: NEGATIVE mg/dL
Hgb urine dipstick: NEGATIVE
Ketones, ur: NEGATIVE mg/dL
Leukocytes,Ua: NEGATIVE
Nitrite: NEGATIVE
Protein, ur: NEGATIVE mg/dL
Specific Gravity, Urine: 1.023 (ref 1.005–1.030)
pH: 7 (ref 5.0–8.0)

## 2022-08-10 LAB — POC URINE PREG, ED: Preg Test, Ur: POSITIVE — AB

## 2022-08-10 MED ORDER — ACETAMINOPHEN 325 MG PO TABS
650.0000 mg | ORAL_TABLET | Freq: Once | ORAL | Status: AC
  Administered 2022-08-10: 650 mg via ORAL
  Filled 2022-08-10: qty 2

## 2022-08-10 NOTE — ED Provider Notes (Signed)
[facility] Provider Note    Event Date/Time   First MD Initiated Contact with Patient 08/10/22 0827     (approximate)   History   Vaginal Bleeding and Abdominal Pain   HPI  Felicia Hatfield is a 32 y.o. female G4, P1 (1 miscarriage, 1 molar pregnancy) who presents today for evaluation of vaginal spotting.  Patient reports that her last menstrual period was 3/27.  She has not yet had an ultrasound.  She denies any abdominal pain.  No fevers or chills.  No nausea or vomiting.  No other vaginal discharge.  No burning with urination.  Patient Active Problem List   Diagnosis Date Noted   Palpitations 02/06/2020   Concentration deficit 02/06/2020   Vacuum-assisted vaginal delivery 04/19/2019   Vaginal delivery 04/17/2019   Normal labor 04/16/2019   Low blood pressure 02/23/2019   Fever    Leukocytosis 05/09/2014   Nausea and vomiting 05/09/2014   Pyelonephritis 05/08/2014   Sepsis (HCC) 05/08/2014          Physical Exam   Triage Vital Signs: ED Triage Vitals  Enc Vitals Group     BP 08/10/22 0812 106/64     Pulse Rate 08/10/22 0812 70     Resp 08/10/22 0812 18     Temp 08/10/22 0812 98.2 F (36.8 C)     Temp src --      SpO2 08/10/22 0812 100 %     Weight 08/10/22 0811 170 lb (77.1 kg)     Height 08/10/22 0811 5\' 3"  (1.6 m)     Head Circumference --      Peak Flow --      Pain Score --      Pain Loc --      Pain Edu? --      Excl. in GC? --     Most recent vital signs: Vitals:   08/10/22 0812  BP: 106/64  Pulse: 70  Resp: 18  Temp: 98.2 F (36.8 C)  SpO2: 100%    Physical Exam Vitals and nursing note reviewed.  Constitutional:      General: Awake and alert. No acute distress.    Appearance: Normal appearance. The patient is normal weight.  HENT:     Head: Normocephalic and atraumatic.     Mouth: Mucous membranes are moist.  Eyes:     General: PERRL. Normal EOMs        Right eye: No discharge.        Left eye: No  discharge.     Conjunctiva/sclera: Conjunctivae normal.  Cardiovascular:     Rate and Rhythm: Normal rate and regular rhythm.     Pulses: Normal pulses.  Pulmonary:     Effort: Pulmonary effort is normal. No respiratory distress.     Breath sounds: Normal breath sounds.  Abdominal:     Abdomen is soft. There is no abdominal tenderness. No rebound or guarding. No distention. Musculoskeletal:        General: No swelling. Normal range of motion.     Cervical back: Normal range of motion and neck supple.  Skin:    General: Skin is warm and dry.     Capillary Refill: Capillary refill takes less than 2 seconds.     Findings: No rash.  Neurological:     Mental Status: The patient is awake and alert.      ED Results / Procedures / Treatments   Labs (all labs ordered are listed, but only  abnormal results are displayed) Labs Reviewed  CBC - Abnormal; Notable for the following components:      Result Value   RBC 3.71 (*)    Hemoglobin 11.6 (*)    HCT 34.6 (*)    All other components within normal limits  HCG, QUANTITATIVE, PREGNANCY - Abnormal; Notable for the following components:   hCG, Beta Chain, Quant, S 248 (*)    All other components within normal limits  BASIC METABOLIC PANEL - Abnormal; Notable for the following components:   Calcium 8.5 (*)    All other components within normal limits  URINALYSIS, ROUTINE W REFLEX MICROSCOPIC - Abnormal; Notable for the following components:   Color, Urine YELLOW (*)    APPearance HAZY (*)    All other components within normal limits  POC URINE PREG, ED - Abnormal; Notable for the following components:   Preg Test, Ur POSITIVE (*)    All other components within normal limits  CBG MONITORING, ED  ABO/RH     EKG     RADIOLOGY I independently reviewed and interpreted imaging and agree with radiologists findings.     PROCEDURES:  Critical Care performed:   Procedures   MEDICATIONS ORDERED IN ED: Medications   acetaminophen (TYLENOL) tablet 650 mg (650 mg Oral Given 08/10/22 1003)     IMPRESSION / MDM / ASSESSMENT AND PLAN / ED COURSE  I reviewed the triage vital signs and the nursing notes.   Differential diagnosis includes, but is not limited to, spontaneous ectopic pregnancy, implantation bleeding, subchorionic hemorrhage.    Patient is awake and alert, hemodynamically stable and afebrile.  She is nontoxic in appearance.  She is in no acute distress.  Further workup is indicated.  Labs obtained show that she is Rh+, no indication for RhoGAM.  She has an hCG that is elevated, but only minimally so, does not correlate with her LMP.  Ultrasound obtained reveals no identifiable intrauterine pregnancy.  Patient began bleeding more heavily during her emergency department visit.  I suspect that she is having a spontaneous abortion, and patient reports that this has happened to her and that is what she feels is happening as well.  I did recommend follow-up in the next couple of days for a repeat hCG to see if it is trending up or down.  Patient has an OB/GYN at [facility], and agrees to follow-up with them.  She was advised that she can come back to the emergency department if she is unable to get an appointment.  We also discussed strict return precautions.  Patient understands and agrees with plan.  She was discharged in stable condition.   Patient's presentation is most consistent with acute complicated illness / injury requiring diagnostic workup.    FINAL CLINICAL IMPRESSION(S) / ED DIAGNOSES   Final diagnoses:  Vaginal bleeding in pregnancy     Rx / DC Orders   ED Discharge Orders     None        Note:  This document was prepared using Dragon voice recognition software and may include unintentional dictation errors.   Keturah Shavers 08/10/22 1110    Jene Every, MD 08/10/22 1113

## 2022-08-10 NOTE — ED Triage Notes (Signed)
Pt via POV from home. Pt approx [redacted] weeks pregnant, pregnancy has not been confirmed by a physician. LMP 3/22. Pt c/o abd cramping and vaginal bleeding, states she had some pinkish discharge that started this AM. Pt is A&OX4 and NAD

## 2022-08-10 NOTE — Discharge Instructions (Signed)
It is quite possible that you are having a miscarriage as we discussed.  However, it is advised that you have a repeat hCG in 1 week to see if it is going up or going down.  You can have this arranged with your OB/GYN, however if you are unable to, please return the emergency department to have this done.  Please return for any other new, worsening, or change in symptoms or other concerns.  It was a pleasure caring for you today.
